# Patient Record
Sex: Female | Born: 1980 | Race: Black or African American | Hispanic: No | Marital: Married | Smoking: Never smoker
Health system: Southern US, Community
[De-identification: ages and names within clinical notes are randomized; demographics above are authoritative.]

## PROBLEM LIST (undated history)

## (undated) HISTORY — PX: CHOLECYSTECTOMY: SHX55

---

## 2019-08-25 ENCOUNTER — Emergency Department (HOSPITAL_COMMUNITY)
Admission: EM | Admit: 2019-08-25 | Discharge: 2019-08-25 | Disposition: A | Discharge status: Home / Self Care | Payer: BC Managed Care – PPO | Attending: Emergency Medicine | Admitting: Emergency Medicine

## 2019-08-25 ENCOUNTER — Encounter (HOSPITAL_COMMUNITY): Payer: Self-pay | Admitting: *Deleted

## 2019-08-25 ENCOUNTER — Other Ambulatory Visit: Payer: Self-pay

## 2019-08-25 DIAGNOSIS — B373 Candidiasis of vulva and vagina: Secondary | ICD-10-CM | POA: Insufficient documentation

## 2019-08-25 DIAGNOSIS — L292 Pruritus vulvae: Secondary | ICD-10-CM | POA: Diagnosis present

## 2019-08-25 DIAGNOSIS — N3001 Acute cystitis with hematuria: Secondary | ICD-10-CM | POA: Insufficient documentation

## 2019-08-25 DIAGNOSIS — N3000 Acute cystitis without hematuria: Secondary | ICD-10-CM

## 2019-08-25 DIAGNOSIS — B3731 Acute candidiasis of vulva and vagina: Secondary | ICD-10-CM

## 2019-08-25 DIAGNOSIS — L509 Urticaria, unspecified: Secondary | ICD-10-CM | POA: Insufficient documentation

## 2019-08-25 LAB — URINALYSIS, ROUTINE W REFLEX MICROSCOPIC
Bilirubin Urine: NEGATIVE
Glucose, UA: NEGATIVE mg/dL
Hgb urine dipstick: NEGATIVE
Ketones, ur: NEGATIVE mg/dL
Nitrite: NEGATIVE
Protein, ur: 30 mg/dL — AB
Specific Gravity, Urine: 1.018 (ref 1.005–1.030)
pH: 6 (ref 5.0–8.0)

## 2019-08-25 LAB — WET PREP, GENITAL
Clue Cells Wet Prep HPF POC: NONE SEEN
Sperm: NONE SEEN
Trich, Wet Prep: NONE SEEN

## 2019-08-25 LAB — POC URINE PREG, ED: Preg Test, Ur: NEGATIVE

## 2019-08-25 MED ORDER — NITROFURANTOIN MONOHYD MACRO 100 MG PO CAPS
100.0000 mg | ORAL_CAPSULE | Freq: Two times a day (BID) | ORAL | 0 refills | Status: DC
Start: 2019-08-25 — End: 2019-09-22

## 2019-08-25 MED ORDER — FLUCONAZOLE 200 MG PO TABS: 200.0000 mg | ORAL_TABLET | Freq: Once | ORAL | 0 refills | Status: AC

## 2019-08-25 MED ORDER — DIPHENHYDRAMINE HCL 25 MG PO CAPS
25.0000 mg | ORAL_CAPSULE | Freq: Once | ORAL | Status: AC
  Administered 2019-08-25: 25 mg via ORAL
  Filled 2019-08-25: qty 1

## 2019-08-25 MED ORDER — FLUCONAZOLE 200 MG PO TABS
200.0000 mg | ORAL_TABLET | Freq: Once | ORAL | Status: AC
  Administered 2019-08-25: 200 mg via ORAL
  Filled 2019-08-25: qty 1

## 2019-08-25 MED ORDER — NITROFURANTOIN MONOHYD MACRO 100 MG PO CAPS
100.0000 mg | ORAL_CAPSULE | Freq: Once | ORAL | Status: AC
  Administered 2019-08-25: 100 mg via ORAL
  Filled 2019-08-25: qty 1

## 2019-08-25 NOTE — ED Notes (Signed)
One touch pt, pt is on antibiotics.

## 2019-08-25 NOTE — ED Provider Notes (Signed)
[facility] EMERGENCY DEPARTMENT Provider Note   CSN: [id number] Arrival date & time: 08/25/19  0034     History Chief Complaint  Patient presents with  . Vaginal Itching    Sandra Walker is a 39 y.o. female with no significant past medical history who presents for evaluation of vaginal pruritus.  Patient states over last 24 hours she has had some pruritus to her bilateral labia.  She denies any discharge or concerns for STDs.  She has had some burning with urination which she states is similar to her prior UTIs.  She is sexually active with her husband.  She does not use protection.  She has no concerns for STDs.  States that she did use Monistat approximately 22 hours ago which did not help.  States she did have a urticaria to her chest earlier today.  No new lotions, perfumes, foods.  She has not taken anything for it.  She denies any current pain.  Prior history of urticaria or allergic reactions.  Denies aggravating relieving factors.  No fever, chills, nausea, vomiting, chest pain, shortness of breath abdominal pain, pelvic pain.  No sensation of throat closing, oral swelling.  Hiastory obtained from patient and past medical records.  No interpreter is used.  HPI     History reviewed. No pertinent past medical history.  There are no problems to display for this patient.   Past Surgical History:  Procedure Laterality Date  . CHOLECYSTECTOMY       OB History   No obstetric history on file.     No family history on file.  Social History   Tobacco Use  . Smoking status: Never Smoker  Substance Use Topics  . Alcohol use: Not Currently  . Drug use: Not Currently    Home Medications Prior to Admission medications   Medication Sig Start Date End Date Taking? Authorizing Provider  fluconazole (DIFLUCAN) 200 MG tablet Take 1 tablet (200 mg total) by mouth once for 1 dose. 08/25/19 08/25/19  Peace Jost A, PA-C  nitrofurantoin,  macrocrystal-monohydrate, (MACROBID) 100 MG capsule Take 1 capsule (100 mg total) by mouth 2 (two) times daily. 08/25/19   Minoru Chap A, PA-C    Allergies    Patient has no allergy information on record.  Review of Systems   Review of Systems  Constitutional: Negative.   HENT: Negative.   Respiratory: Negative.   Gastrointestinal: Negative.   Genitourinary: Positive for dysuria. Negative for decreased urine volume, difficulty urinating, dyspareunia, enuresis, flank pain, frequency, hematuria, menstrual problem, pelvic pain, urgency, vaginal bleeding and vaginal pain.       Labial pruritis  Musculoskeletal: Negative.   Skin: Positive for rash.  Neurological: Negative.   All other systems reviewed and are negative.   Physical Exam Updated Vital Signs BP 125/80 (BP Location: Right Arm)   Pulse 82   Temp 98.5 F (36.9 C) (Oral)   Resp 16   LMP 08/04/2019   SpO2 100%   Physical Exam Vitals and nursing note reviewed.  Constitutional:      General: She is not in acute distress.    Appearance: She is well-developed. She is not ill-appearing, toxic-appearing or diaphoretic.  HENT:     Head: Normocephalic and atraumatic.     Nose: Nose normal.     Mouth/Throat:     Mouth: Mucous membranes are moist.     Comments: No intraoral edema.  No drooling, dysphagia or trismus.  Posterior oropharynx visualized without erythema or exudate.  Eyes:     Pupils: Pupils are equal, round, and reactive to light.  Cardiovascular:     Rate and Rhythm: Normal rate.     Pulses: Normal pulses.     Heart sounds: Normal heart sounds.  Pulmonary:     Effort: Pulmonary effort is normal. No respiratory distress.     Breath sounds: Normal breath sounds.  Abdominal:     General: Bowel sounds are normal. There is no distension.     Comments: Soft, nontender.  Normoactive bowel sounds.  Genitourinary:    Comments: Normal appearing external female genitalia without rashes or lesions, normal vaginal  epithelium. Normal appearing cervix with moderate thick white discharge. No cervical petechiae. Cervical os is closed. There is no bleeding noted at the os.No odor. Bimanual: No CMT, nontender.  No palpable adnexal masses or tenderness. Uterus midline and not fixed. Rectovaginal exam was deferred.  No cystocele or rectocele noted. No pelvic lymphadenopathy noted. Wet prep was obtained.  Cultures for gonorrhea and chlamydia collected. Exam performed with chaperone in room. Musculoskeletal:        General: Normal range of motion.     Cervical back: Normal range of motion and neck supple.  Skin:    General: Skin is warm and dry.     Capillary Refill: Capillary refill takes less than 2 seconds.     Comments: Urticaria to anterior chest wall.  No vesicles, bulla, target lesions, purpura, petechiae.  No fluctuance, induration.  No edema, erythema or warmth.  Neurological:     General: No focal deficit present.     Mental Status: She is alert.     ED Results / Procedures / Treatments   Labs (all labs ordered are listed, but only abnormal results are displayed) Labs Reviewed  WET PREP, GENITAL - Abnormal; Notable for the following components:      Result Value   Yeast Wet Prep HPF POC PRESENT (*)    WBC, Wet Prep HPF POC MODERATE (*)    All other components within normal limits  URINALYSIS, ROUTINE W REFLEX MICROSCOPIC - Abnormal; Notable for the following components:   APPearance CLOUDY (*)    Protein, ur 30 (*)    Leukocytes,Ua LARGE (*)    Bacteria, UA FEW (*)    All other components within normal limits  URINE CULTURE  POC URINE PREG, ED  GC/CHLAMYDIA PROBE AMP ([place]) NOT AT [facility]    EKG None  Radiology No results found.  Procedures Procedures (including critical care time)  Medications Ordered in ED Medications  nitrofurantoin (macrocrystal-monohydrate) (MACROBID) capsule 100 mg (has no administration in time range)  fluconazole (DIFLUCAN) tablet 200 mg (has no  administration in time range)  diphenhydrAMINE (BENADRYL) capsule 25 mg (25 mg Oral Given 08/25/19 0209)    ED Course  I have reviewed the triage vital signs and the nursing notes.  Pertinent labs & imaging results that were available during my care of the patient were reviewed by me and considered in my medical decision making (see chart for details).  39 year old female presents for evaluation multiple complaints.  She is afebrile, nonseptic, not ill-appearing.  First patient with dysuria.  No hematuria, flank pain or abdominal pain.  She is tolerating p.o. intake without difficulty.  Abdomen soft, nontender.  Urinalysis with large leuks, bacteria as well as 21-50 WBC.  Culture urine.  Given symptomatic we will start antibiotics.  Can de-escalate if culture does not grow.  Patient also with urticaria to anterior chest wall.  No new lotions, perfumes, food intake. Patient denies any difficulty breathing or swallowing.  Pt has a patent airway without stridor and is handling secretions without difficulty; no angioedema. No blisters, no pustules, no warmth, no draining sinus tracts, no superficial abscesses, no bullous impetigo, no vesicles, no desquamation, no target lesions with dusky purpura or a central bulla. Not tender to touch. No concern for superimposed infection. No concern for SJS, TEN, TSS, tick borne illness, syphilis or other life-threatening condition.  Give dose of Benadryl.  No evidence of anaphylaxis.  Patient also with vaginal pruritus.  No concerns for STDs.  No pelvic pain.  Patient with thick white discharge on GU exam however she had previously used Monistat.  Will obtain wet prep, GC, chlamydia.  No CMT tenderness or adnexal tenderness.  Low suspicion for PID, TOA, torsion.  Wet prep with yeast as well as WBC. GC/ Chlamydia pending Pregnancy negative UA with UTI  Patient started on Benadryl at home.  Antibiotics given for UTI.  1 dose given of Diflucan here in ED given OTC  medications have not improved patient's symptoms.  The patient has been appropriately medically screened and/or stabilized in the ED. I have low suspicion for any other emergent medical condition which would require further screening, evaluation or treatment in the ED or require inpatient management.  Patient is hemodynamically stable and in no acute distress.  Patient able to ambulate in department prior to ED.  Evaluation does not show acute pathology that would require ongoing or additional emergent interventions while in the emergency department or further inpatient treatment.  I have discussed the diagnosis with the patient and answered all questions.  Pain is been managed while in the emergency department and patient has no further complaints prior to discharge.  Patient is comfortable with plan discussed in room and is stable for discharge at this time.  I have discussed strict return precautions for returning to the emergency department.  Patient was encouraged to follow-up with PCP/specialist refer to at discharge.   MDM Rules/Calculators/A&P                       Final Clinical Impression(s) / ED Diagnoses Final diagnoses:  Urticaria  Acute cystitis without hematuria  Yeast vaginitis    Rx / DC Orders ED Discharge Orders         Ordered    nitrofurantoin, macrocrystal-monohydrate, (MACROBID) 100 MG capsule  2 times daily     08/25/19 0231    fluconazole (DIFLUCAN) 200 MG tablet   Once     08/25/19 0231           Gwendy Boeder A, PA-C 08/25/19 0232    Merryl Hacker, MD 08/25/19 [phone]

## 2019-08-25 NOTE — ED Triage Notes (Signed)
Onset of vaginal itching last night, denies discharge. Burning with urination. Used Monistat dose today, not improved. Onset of rash today on torso.

## 2019-08-25 NOTE — Discharge Instructions (Addendum)
Take the medications as prescribed.  You may take Benadryl or an antihistamine such as Zyrtec or Claritin for your hives.  You develop shortness of breath, sensation of throat closing please seek reevaluation  Written you an antibiotic for your urinary tract infection.  I have given you your first dose of an antibiotic for your yeast infection.  If you continue to have symptoms after 72 hours you may take this additional tablet.  Follow-up outpatient with PCP.  Return for new or worsening symptoms.

## 2019-08-26 LAB — GC/CHLAMYDIA PROBE AMP (~~LOC~~) NOT AT ARMC
Chlamydia: NEGATIVE
Comment: NEGATIVE
Comment: NORMAL
Neisseria Gonorrhea: NEGATIVE

## 2019-08-27 LAB — URINE CULTURE: Culture: 20000 — AB

## 2019-09-08 ENCOUNTER — Ambulatory Visit (HOSPITAL_COMMUNITY)
Admission: EM | Admit: 2019-09-08 | Discharge: 2019-09-08 | Disposition: A | Discharge status: Home / Self Care | Payer: BC Managed Care – PPO | Attending: Family Medicine | Admitting: Family Medicine

## 2019-09-08 ENCOUNTER — Encounter (HOSPITAL_COMMUNITY): Payer: Self-pay

## 2019-09-08 ENCOUNTER — Other Ambulatory Visit: Payer: Self-pay

## 2019-09-08 DIAGNOSIS — L259 Unspecified contact dermatitis, unspecified cause: Secondary | ICD-10-CM

## 2019-09-08 MED ORDER — PREDNISONE 10 MG (21) PO TBPK
ORAL_TABLET | Freq: Every day | ORAL | 0 refills | Status: DC
Start: 2019-09-08 — End: 2019-09-22

## 2019-09-08 NOTE — ED Triage Notes (Signed)
Pt is here with a rash for a week now, pt states it itches then hurts. Pt has taken Benadryl to relieve discomfort. 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

## 2019-09-08 NOTE — ED Provider Notes (Signed)
[facility]   [id number] 09/08/19 Arrival Time: 4098  ASSESSMENT & PLAN:  1. Contact dermatitis, unspecified contact dermatitis type, unspecified trigger    Unknown trigger.  Begin: Meds ordered this encounter  Medications  . predniSONE (STERAPRED UNI-PAK 21 TAB) 10 MG (21) TBPK tablet    Sig: Take by mouth daily. Take as directed.    Dispense:  21 tablet    Refill:  0    Benadryl if needed. Work note provided.  Will follow up with PCP or here if worsening or failing to improve as anticipated. Reviewed expectations re: course of current medical issues. Questions answered. Outlined signs and symptoms indicating need for more acute intervention. Patient verbalized understanding. After Visit Summary given.   SUBJECTIVE:  Syrena Burges is a 39 y.o. female who presents with a skin complaint. "Itchy rash" first noted over the past week. Trunk and arms. Questions related to dust on boxes at work. Benadryl without much relief; "some"; temporary. No swallowing or respiratory difficulties. No h/o similar.    OBJECTIVE: Vitals:   09/08/19 1519 09/08/19 1522  BP:  135/76  Pulse:  74  Resp:  18  Temp:  98.6 F (37 C)  TempSrc:  Oral  SpO2:  100%  Weight: 87.1 kg     General appearance: alert; no distress HEENT: [place]; AT Neck: supple with FROM Lungs: clear to auscultation bilaterally Heart: regular rate and rhythm Extremities: no edema; moves all extremities normally Skin: warm and dry; various areas of skin irritation and papular rash over trunk and extremities; some areas of excoriation; no sign of skin infeciton Psychological: alert and cooperative; normal mood and affect  Allergies  Allergen Reactions  . Penicillin G Anaphylaxis    History reviewed. No pertinent past medical history. Social History   Socioeconomic History  . Marital status: Married    Spouse name: Not on file  . Number of children: Not on file  . Years of education: Not on file  .  Highest education level: Not on file  Occupational History  . Not on file  Tobacco Use  . Smoking status: Never Smoker  . Smokeless tobacco: Never Used  Substance and Sexual Activity  . Alcohol use: Not Currently  . Drug use: Not Currently  . Sexual activity: Yes    Birth control/protection: None    Comment: Married  Other Topics Concern  . Not on file  Social History Narrative  . Not on file   Social Determinants of Health   Financial Resource Strain:   . Difficulty of Paying Living Expenses:   Food Insecurity:   . Worried About Charity fundraiser in the Last Year:   . Arboriculturist in the Last Year:   Transportation Needs:   . Film/video editor (Medical):   Marland Kitchen Lack of Transportation (Non-Medical):   Physical Activity:   . Days of Exercise per Week:   . Minutes of Exercise per Session:   Stress:   . Feeling of Stress :   Social Connections:   . Frequency of Communication with Friends and Family:   . Frequency of Social Gatherings with Friends and Family:   . Attends Religious Services:   . Active Member of Clubs or Organizations:   . Attends Archivist Meetings:   Marland Kitchen Marital Status:   Intimate Partner Violence:   . Fear of Current or Ex-Partner:   . Emotionally Abused:   Marland Kitchen Physically Abused:   . Sexually Abused:  Family History  Problem Relation Age of Onset  . Healthy Mother   . Heart Problems Father   . Diabetes Father    Past Surgical History:  Procedure Laterality Date  . Rosaura Carpenter, MD 09/08/19 1630

## 2019-09-22 ENCOUNTER — Other Ambulatory Visit: Payer: Self-pay

## 2019-09-22 ENCOUNTER — Ambulatory Visit (HOSPITAL_COMMUNITY)
Admission: EM | Admit: 2019-09-22 | Discharge: 2019-09-22 | Disposition: A | Discharge status: Home / Self Care | Payer: BC Managed Care – PPO | Attending: Family Medicine | Admitting: Family Medicine

## 2019-09-22 ENCOUNTER — Encounter (HOSPITAL_COMMUNITY): Payer: Self-pay | Admitting: Emergency Medicine

## 2019-09-22 DIAGNOSIS — R21 Rash and other nonspecific skin eruption: Secondary | ICD-10-CM | POA: Diagnosis not present

## 2019-09-22 MED ORDER — HYDROXYZINE HCL 25 MG PO TABS
25.0000 mg | ORAL_TABLET | Freq: Four times a day (QID) | ORAL | 0 refills | Status: DC
Start: 2019-09-22 — End: 2020-07-31

## 2019-09-22 MED ORDER — PERMETHRIN 5 % EX CREA
TOPICAL_CREAM | CUTANEOUS | 1 refills | Status: DC
Start: 2019-09-22 — End: 2020-10-02

## 2019-09-22 MED ORDER — FLUOCINONIDE 0.05 % EX CREA: 1.0000 "application " | TOPICAL_CREAM | Freq: Two times a day (BID) | CUTANEOUS | 0 refills | Status: DC

## 2019-09-22 NOTE — ED Triage Notes (Signed)
Patient seen 6/10 for the same.  Rash on inner thighs, chest and arms.  medicine seemed to help, but once finished, rash and itching has returned

## 2019-09-27 NOTE — ED Provider Notes (Signed)
[place]   [id number] 09/22/19 Arrival Time: 3335  ASSESSMENT & PLAN:  1. Rash and nonspecific skin eruption      Meds ordered this encounter  Medications  . hydrOXYzine (ATARAX/VISTARIL) 25 MG tablet    Sig: Take 1 tablet (25 mg total) by mouth every 6 (six) hours.    Dispense:  20 tablet    Refill:  0  . fluocinonide cream (LIDEX) 0.05 %    Sig: Apply 1 application topically 2 (two) times daily.    Dispense:  60 g    Refill:  0  . permethrin (ELIMITE) 5 % cream    Sig: Apply from neck down before bed then wash off in the morning. May repeat in one week.    Dispense:  60 g    Refill:  1    May need dermatology evaluation if not improving.   Reviewed expectations re: course of current medical issues. Questions answered. Outlined signs and symptoms indicating need for more acute intervention. Patient verbalized understanding. After Visit Summary given.   SUBJECTIVE:  Sandra Walker is a 39 y.o. female who presents with a skin complaint. Same as last visit with me. Intense itching; inner thighs, upper chest, some on arms. Prednisone helped but "rash is back". No new exposures. No OTC tx.    OBJECTIVE: Vitals:   09/22/19 1817  BP: 129/77  Pulse: 73  Resp: 18  Temp: 99.1 F (37.3 C)  TempSrc: Oral  SpO2: 99%    General appearance: alert; no distress HEENT: [place]; AT Neck: supple with FROM Lungs: clear to auscultation bilaterally Heart: regular rate and rhythm Extremities: no edema; moves all extremities normally Skin: warm and dry; various areas of skin irritation and papular rash over trunk and extremities; some areas of excoriation; no sign of skin infeciton Psychological: alert and cooperative; normal mood and affect  Allergies  Allergen Reactions  . Penicillin G Anaphylaxis    History reviewed. No pertinent past medical history. Social History   Socioeconomic History  . Marital status: Married    Spouse name: Not on file  . Number  of children: Not on file  . Years of education: Not on file  . Highest education level: Not on file  Occupational History  . Not on file  Tobacco Use  . Smoking status: Never Smoker  . Smokeless tobacco: Never Used  Substance and Sexual Activity  . Alcohol use: Not Currently  . Drug use: Not Currently  . Sexual activity: Yes    Birth control/protection: None    Comment: Married  Other Topics Concern  . Not on file  Social History Narrative  . Not on file   Social Determinants of Health   Financial Resource Strain:   . Difficulty of Paying Living Expenses:   Food Insecurity:   . Worried About Charity fundraiser in the Last Year:   . Arboriculturist in the Last Year:   Transportation Needs:   . Film/video editor (Medical):   Marland Kitchen Lack of Transportation (Non-Medical):   Physical Activity:   . Days of Exercise per Week:   . Minutes of Exercise per Session:   Stress:   . Feeling of Stress :   Social Connections:   . Frequency of Communication with Friends and Family:   . Frequency of Social Gatherings with Friends and Family:   . Attends Religious Services:   . Active Member of Clubs or Organizations:   . Attends Archivist Meetings:   .  Marital Status:   Intimate Partner Violence:   . Fear of Current or Ex-Partner:   . Emotionally Abused:   Marland Kitchen Physically Abused:   . Sexually Abused:    Family History  Problem Relation Age of Onset  . Healthy Mother   . Heart Problems Father   . Diabetes Father    Past Surgical History:  Procedure Laterality Date  . Rosaura Carpenter, MD 09/27/19 [phone]

## 2020-01-17 ENCOUNTER — Encounter (HOSPITAL_COMMUNITY): Payer: Self-pay | Admitting: Emergency Medicine

## 2020-01-17 ENCOUNTER — Other Ambulatory Visit: Payer: Self-pay

## 2020-01-17 ENCOUNTER — Telehealth (HOSPITAL_COMMUNITY): Payer: Self-pay | Admitting: Emergency Medicine

## 2020-01-17 ENCOUNTER — Ambulatory Visit (HOSPITAL_COMMUNITY)
Admission: EM | Admit: 2020-01-17 | Discharge: 2020-01-17 | Disposition: A | Discharge status: Home / Self Care | Payer: 59 | Attending: Family Medicine | Admitting: Family Medicine

## 2020-01-17 DIAGNOSIS — L239 Allergic contact dermatitis, unspecified cause: Secondary | ICD-10-CM | POA: Diagnosis not present

## 2020-01-17 DIAGNOSIS — J3089 Other allergic rhinitis: Secondary | ICD-10-CM

## 2020-01-17 DIAGNOSIS — H1011 Acute atopic conjunctivitis, right eye: Secondary | ICD-10-CM | POA: Diagnosis not present

## 2020-01-17 MED ORDER — FLUTICASONE PROPIONATE 50 MCG/ACT NA SUSP
1.0000 | Freq: Two times a day (BID) | NASAL | 0 refills | Status: DC
Start: 2020-01-17 — End: 2020-01-17

## 2020-01-17 MED ORDER — PREDNISONE 10 MG PO TABS: ORAL_TABLET | ORAL | 0 refills | Status: DC

## 2020-01-17 MED ORDER — AZELASTINE HCL 0.05 % OP SOLN: 1.0000 [drp] | Freq: Two times a day (BID) | OPHTHALMIC | 0 refills | Status: DC

## 2020-01-17 MED ORDER — FLUTICASONE PROPIONATE 50 MCG/ACT NA SUSP: 1.0000 | Freq: Two times a day (BID) | NASAL | 0 refills | Status: DC

## 2020-01-17 MED ORDER — LEVOCETIRIZINE DIHYDROCHLORIDE 5 MG PO TABS: 5.0000 mg | ORAL_TABLET | Freq: Every evening | ORAL | 0 refills | Status: DC

## 2020-01-17 MED ORDER — MONTELUKAST SODIUM 10 MG PO TABS: 10.0000 mg | ORAL_TABLET | Freq: Every day | ORAL | 0 refills | Status: DC

## 2020-01-17 MED ORDER — MONTELUKAST SODIUM 10 MG PO TABS
10.0000 mg | ORAL_TABLET | Freq: Every day | ORAL | 0 refills | Status: DC
Start: 2020-01-17 — End: 2020-01-17

## 2020-01-17 NOTE — ED Triage Notes (Addendum)
Sob for 2 days, right eye pressure, patient has a cough, no fever.  Chest soreness noticed with coughing  Patient has seen eye provider for this eye complaint  Patient reports eye issue is a recurrent issue

## 2020-01-17 NOTE — ED Provider Notes (Signed)
[facility]    CSN: [id number] Arrival date & time: 01/17/20  1123      History   Chief Complaint Chief Complaint  Patient presents with  . Shortness of Breath    HPI Sandra Walker is a 39 y.o. female.   Here today with an ongoing issue of itching all over body with hives intermittently, nasal congestion, sinus pressure, mild SOB and chest tightness, and right eye irritation, redness, itching. Was given steroid taper by PCP recently for the rash which resolved issue but issue has come back. Eye doctor prescribed systane drops for eye but this did not help. Took claritin a few times but stopped because she didn't feel it was helping. Recently moved here a year ago from [facility] and since has been having these issues since the move. Denies CP, significant SOB, fever, chills, N/V, abdominal pain.      History reviewed. No pertinent past medical history.  There are no problems to display for this patient.   Past Surgical History:  Procedure Laterality Date  . CHOLECYSTECTOMY      OB History   No obstetric history on file.      Home Medications    Prior to Admission medications   Medication Sig Start Date End Date Taking? Authorizing Provider  fluocinonide cream (LIDEX) 2.20 % Apply 1 application topically 2 (two) times daily. 09/22/19  Yes Hagler, Aaron Edelman, MD  azelastine (OPTIVAR) 0.05 % ophthalmic solution Place 1 drop into the right eye 2 (two) times daily. 01/17/20   Volney American, PA-C  fluticasone [facility]) 50 MCG/ACT nasal spray Place 1 spray into both nostrils 2 (two) times daily. 01/17/20   Volney American, PA-C  hydrOXYzine (ATARAX/VISTARIL) 25 MG tablet Take 1 tablet (25 mg total) by mouth every 6 (six) hours. 09/22/19   Vanessa Kick, MD  levocetirizine (XYZAL) 5 MG tablet Take 1 tablet (5 mg total) by mouth every evening. 01/17/20   Volney American, PA-C  montelukast (SINGULAIR) 10 MG tablet Take 1 tablet (10 mg total) by mouth at  bedtime. 01/17/20   Volney American, PA-C  permethrin (ELIMITE) 5 % cream Apply from neck down before bed then wash off in the morning. May repeat in one week. 09/22/19   Vanessa Kick, MD  predniSONE (DELTASONE) 10 MG tablet Take 6 tabs day one, 5 tabs day two, 4 tabs day three, etc 01/17/20   Volney American, PA-C    Family History Family History  Problem Relation Age of Onset  . Healthy Mother   . Heart Problems Father   . Diabetes Father     Social History Social History   Tobacco Use  . Smoking status: Never Smoker  . Smokeless tobacco: Never Used  Substance Use Topics  . Alcohol use: Yes  . Drug use: Not Currently     Allergies   Penicillin g   Review of Systems Review of Systems PER HPI   Physical Exam Triage Vital Signs ED Triage Vitals  Enc Vitals Group     BP 01/17/20 1228 119/84     Pulse Rate 01/17/20 1228 80     Resp 01/17/20 1228 20     Temp 01/17/20 1228 99 F (37.2 C)     Temp Source 01/17/20 1228 Oral     SpO2 01/17/20 1228 100 %     Weight --      Height --      Head Circumference --      Peak Flow --  Pain Score 01/17/20 1224 6     Pain Loc --      Pain Edu? --      Excl. in [place]? --    No data found.  Updated Vital Signs BP 119/84 (BP Location: Left Arm)   Pulse 80   Temp 99 F (37.2 C) (Oral)   Resp 20   LMP 01/17/2020   SpO2 100%   Visual Acuity Right Eye Distance:   Left Eye Distance:   Bilateral Distance:    Right Eye Near:   Left Eye Near:    Bilateral Near:     Physical Exam Vitals and nursing note reviewed.  Constitutional:      Appearance: Normal appearance. She is not ill-appearing.  HENT:     Head: Atraumatic.     Nose:     Comments: B/l nares erythematous, edematous, rhinorrhea present    Mouth/Throat:     Mouth: Mucous membranes are moist.     Pharynx: Posterior oropharyngeal erythema present.  Eyes:     Extraocular Movements: Extraocular movements intact.     Conjunctiva/sclera:  Conjunctivae normal.  Cardiovascular:     Rate and Rhythm: Normal rate and regular rhythm.     Heart sounds: Normal heart sounds.  Pulmonary:     Effort: Pulmonary effort is normal.     Breath sounds: Normal breath sounds. No wheezing or rales.  Musculoskeletal:        General: Normal range of motion.     Cervical back: Normal range of motion and neck supple.  Skin:    General: Skin is warm and dry.     Findings: Rash present.  Neurological:     Mental Status: She is alert and oriented to person, place, and time.  Psychiatric:        Mood and Affect: Mood normal.        Thought Content: Thought content normal.        Judgment: Judgment normal.     UC Treatments / Results  Labs (all labs ordered are listed, but only abnormal results are displayed) Labs Reviewed - No data to display  EKG   Radiology No results found.  Procedures Procedures (including critical care time)  Medications Ordered in UC Medications - No data to display  Initial Impression / Assessment and Plan / UC Course  I have reviewed the triage vital signs and the nursing notes.  Pertinent labs & imaging results that were available during my care of the patient were reviewed by me and considered in my medical decision making (see chart for details).     Suspect poorly controlled allergic rhinitis, conjunctivitis, and dermatitis causing majority of her sxs - tx with consistent use of singulair, xyzal, optivar drops and flonase as well as prednisone taper for initial benefit. F/u with PCP and consider allergy testing if recurring.   Final Clinical Impressions(s) / UC Diagnoses   Final diagnoses:  Seasonal allergic rhinitis due to other allergic trigger  Allergic conjunctivitis of right eye  Allergic dermatitis   Discharge Instructions   None    ED Prescriptions    Medication Sig Dispense Auth. Provider   predniSONE (DELTASONE) 10 MG tablet Take 6 tabs day one, 5 tabs day two, 4 tabs day three,  etc 21 tablet Volney American, PA-C   levocetirizine (XYZAL) 5 MG tablet Take 1 tablet (5 mg total) by mouth every evening. 30 tablet Volney American, PA-C   montelukast (SINGULAIR) 10 MG tablet Take 1 tablet (  10 mg total) by mouth at bedtime. 30 tablet Volney American, [place]   azelastine (OPTIVAR) 0.05 % ophthalmic solution Place 1 drop into the right eye 2 (two) times daily. 6 mL Volney American, PA-C   fluticasone [facility]) 50 MCG/ACT nasal spray Place 1 spray into both nostrils 2 (two) times daily. 16 g Volney American, [place]     PDMP not reviewed this encounter.   Volney American, [place] 01/17/20 1321

## 2020-01-17 NOTE — Telephone Encounter (Signed)
Patient called stating [facility] is considered "out-of-network" and asked for prescriptions to be resent to [facility] across the street.  Prescriptions resent.

## 2020-02-18 ENCOUNTER — Other Ambulatory Visit: Payer: Self-pay

## 2020-02-18 ENCOUNTER — Encounter (HOSPITAL_COMMUNITY): Payer: Self-pay | Admitting: *Deleted

## 2020-02-18 ENCOUNTER — Emergency Department (HOSPITAL_COMMUNITY): Payer: 59

## 2020-02-18 ENCOUNTER — Emergency Department (HOSPITAL_COMMUNITY)
Admission: EM | Admit: 2020-02-18 | Discharge: 2020-02-18 | Disposition: A | Discharge status: Home / Self Care | Payer: 59 | Attending: Emergency Medicine | Admitting: Emergency Medicine

## 2020-02-18 DIAGNOSIS — R519 Headache, unspecified: Secondary | ICD-10-CM | POA: Diagnosis present

## 2020-02-18 DIAGNOSIS — G43809 Other migraine, not intractable, without status migrainosus: Secondary | ICD-10-CM

## 2020-02-18 LAB — CBC WITH DIFFERENTIAL/PLATELET
Abs Immature Granulocytes: 0.01 10*3/uL (ref 0.00–0.07)
Basophils Absolute: 0 10*3/uL (ref 0.0–0.1)
Basophils Relative: 1 %
Eosinophils Absolute: 0.4 10*3/uL (ref 0.0–0.5)
Eosinophils Relative: 7 %
HCT: 40.8 % (ref 36.0–46.0)
Hemoglobin: 13.1 g/dL (ref 12.0–15.0)
Immature Granulocytes: 0 %
Lymphocytes Relative: 37 %
Lymphs Abs: 2.2 10*3/uL (ref 0.7–4.0)
MCH: 30.1 pg (ref 26.0–34.0)
MCHC: 32.1 g/dL (ref 30.0–36.0)
MCV: 93.8 fL (ref 80.0–100.0)
Monocytes Absolute: 0.5 10*3/uL (ref 0.1–1.0)
Monocytes Relative: 8 %
Neutro Abs: 2.9 10*3/uL (ref 1.7–7.7)
Neutrophils Relative %: 47 %
Platelets: 274 10*3/uL (ref 150–400)
RBC: 4.35 MIL/uL (ref 3.87–5.11)
RDW: 12.3 % (ref 11.5–15.5)
WBC: 6 10*3/uL (ref 4.0–10.5)
nRBC: 0 % (ref 0.0–0.2)

## 2020-02-18 LAB — BASIC METABOLIC PANEL
Anion gap: 8 (ref 5–15)
BUN: 12 mg/dL (ref 6–20)
CO2: 26 mmol/L (ref 22–32)
Calcium: 9.3 mg/dL (ref 8.9–10.3)
Chloride: 106 mmol/L (ref 98–111)
Creatinine, Ser: 0.82 mg/dL (ref 0.44–1.00)
GFR, Estimated: >60 mL/min (ref >60)
Glucose, Bld: 87 mg/dL (ref 70–99)
Potassium: 4 mmol/L (ref 3.5–5.1)
Sodium: 140 mmol/L (ref 135–145)

## 2020-02-18 LAB — I-STAT BETA HCG BLOOD, ED (MC, WL, AP ONLY): I-stat hCG, quantitative: <5 m[IU]/mL (ref <5)

## 2020-02-18 MED ORDER — DEXAMETHASONE SODIUM PHOSPHATE 4 MG/ML IJ SOLN
4.0000 mg | Freq: Once | INTRAMUSCULAR | Status: AC
  Administered 2020-02-18: 4 mg via INTRAVENOUS
  Filled 2020-02-18: qty 1

## 2020-02-18 MED ORDER — SUMATRIPTAN SUCCINATE 100 MG PO TABS
100.0000 mg | ORAL_TABLET | ORAL | 0 refills | Status: DC | PRN
Start: 2020-02-18 — End: 2020-10-02

## 2020-02-18 MED ORDER — METOCLOPRAMIDE HCL 5 MG/ML IJ SOLN
10.0000 mg | Freq: Once | INTRAMUSCULAR | Status: AC
  Administered 2020-02-18: 10 mg via INTRAVENOUS
  Filled 2020-02-18: qty 2

## 2020-02-18 MED ORDER — DIPHENHYDRAMINE HCL 50 MG/ML IJ SOLN
25.0000 mg | Freq: Once | INTRAMUSCULAR | Status: AC
  Administered 2020-02-18: 25 mg via INTRAVENOUS
  Filled 2020-02-18: qty 1

## 2020-02-18 MED ORDER — SODIUM CHLORIDE 0.9 % IV BOLUS
1000.0000 mL | Freq: Once | INTRAVENOUS | Status: AC
  Administered 2020-02-18: 1000 mL via INTRAVENOUS

## 2020-02-18 NOTE — ED Notes (Signed)
Pt taken to CT.

## 2020-02-18 NOTE — ED Provider Notes (Signed)
[facility] EMERGENCY DEPARTMENT Provider Note   CSN: [id number] Arrival date & time: 02/18/20  1609     History Chief Complaint  Patient presents with  . Migraine    Sandra Walker is a 39 y.o. female here presenting with headaches.  Patient has been having intermittent headaches for about a year or so.  For the last 3 weeks it got worse.  The last 2 days she felt something on the right side of her head that is moving.  She was concerned because her brother had similar symptoms and turned out he had hydrocephalus and needed a shunt.  Patient states that she was seen at [place] previously and is still on Excedrin.  She has been taking Excedrin with no relief.  Patient denies any nausea or vomiting or neck pain or fevers.  The history is provided by the patient.       History reviewed. No pertinent past medical history.  There are no problems to display for this patient.   Past Surgical History:  Procedure Laterality Date  . CHOLECYSTECTOMY       OB History   No obstetric history on file.     Family History  Problem Relation Age of Onset  . Healthy Mother   . Heart Problems Father   . Diabetes Father     Social History   Tobacco Use  . Smoking status: Never Smoker  . Smokeless tobacco: Never Used  Substance Use Topics  . Alcohol use: Yes  . Drug use: Not Currently    Home Medications Prior to Admission medications   Medication Sig Start Date End Date Taking? Authorizing Provider  azelastine (OPTIVAR) 0.05 % ophthalmic solution Place 1 drop into the right eye 2 (two) times daily. 01/17/20   Volney American, PA-C  fluocinonide cream (LIDEX) 0.17 % Apply 1 application topically 2 (two) times daily. 09/22/19   Vanessa Kick, MD  fluticasone (FLONASE) 50 MCG/ACT nasal spray Place 1 spray into both nostrils 2 (two) times daily. 01/17/20   Volney American, PA-C  hydrOXYzine (ATARAX/VISTARIL) 25 MG tablet Take 1 tablet (25 mg total)  by mouth every 6 (six) hours. 09/22/19   Vanessa Kick, MD  levocetirizine (XYZAL) 5 MG tablet Take 1 tablet (5 mg total) by mouth every evening. 01/17/20   Volney American, PA-C  montelukast (SINGULAIR) 10 MG tablet Take 1 tablet (10 mg total) by mouth at bedtime. 01/17/20   Volney American, PA-C  permethrin (ELIMITE) 5 % cream Apply from neck down before bed then wash off in the morning. May repeat in one week. 09/22/19   Vanessa Kick, MD  predniSONE (DELTASONE) 10 MG tablet Take 6 tabs day one, 5 tabs day two, 4 tabs day three, etc 01/17/20   Volney American, PA-C    Allergies    Penicillin g and Shellfish allergy  Review of Systems   Review of Systems  Neurological: Positive for headaches.  All other systems reviewed and are negative.   Physical Exam Updated Vital Signs BP 113/75 (BP Location: Right Arm)   Pulse 61   Temp 97.8 F (36.6 C) (Oral)   Resp 18   Ht 5\' 8"  (1.727 m)   Wt 91.6 kg   LMP 02/11/2020   SpO2 100%   BMI 30.71 kg/m   Physical Exam Vitals and nursing note reviewed.  Constitutional:      Comments: Slightly uncomfortable   HENT:     Head: Normocephalic.  Nose: Nose normal.     Mouth/Throat:     Mouth: Mucous membranes are moist.  Eyes:     Extraocular Movements: Extraocular movements intact.     Pupils: Pupils are equal, round, and reactive to light.  Cardiovascular:     Rate and Rhythm: Normal rate and regular rhythm.     Pulses: Normal pulses.     Heart sounds: Normal heart sounds.  Pulmonary:     Effort: Pulmonary effort is normal.     Breath sounds: Normal breath sounds.  Abdominal:     General: Abdomen is flat.     Palpations: Abdomen is soft.  Musculoskeletal:        General: Normal range of motion.     Cervical back: Normal range of motion and neck supple.  Skin:    General: Skin is warm.     Capillary Refill: Capillary refill takes less than 2 seconds.  Neurological:     General: No focal deficit present.      Mental Status: She is alert and oriented to person, place, and time.     Cranial Nerves: No cranial nerve deficit.     Sensory: No sensory deficit.     Motor: No weakness.     Coordination: Coordination normal.  Psychiatric:        Mood and Affect: Mood normal.        Behavior: Behavior normal.     ED Results / Procedures / Treatments   Labs (all labs ordered are listed, but only abnormal results are displayed) Labs Reviewed  CBC WITH DIFFERENTIAL/PLATELET  BASIC METABOLIC PANEL  I-STAT BETA HCG BLOOD, ED ([facility], [facility], [facility] ONLY)    EKG None  Radiology CT Head Wo Contrast  Result Date: 02/18/2020 CLINICAL DATA:  Cerebral hemorrhage suspected. Migraine for 2 weeks. EXAM: CT HEAD WITHOUT CONTRAST TECHNIQUE: Contiguous axial images were obtained from the base of the skull through the vertex without intravenous contrast. COMPARISON:  None. FINDINGS: Brain: No evidence of acute infarction, hemorrhage, hydrocephalus, extra-axial collection or mass lesion/mass effect. Vascular: No hyperdense vessel or unexpected calcification. Skull: Normal. Negative for fracture or focal lesion. Sinuses/Orbits: No acute finding. IMPRESSION: Negative head CT. Electronically Signed   By: Monte Fantasia M.D.   On: 02/18/2020 19:21    Procedures Procedures (including critical care time)  Medications Ordered in ED Medications  sodium chloride 0.9 % bolus 1,000 mL (1,000 mLs Intravenous New Bag/Given 02/18/20 1729)  metoCLOPramide (REGLAN) injection 10 mg (10 mg Intravenous Given 02/18/20 1806)  diphenhydrAMINE (BENADRYL) injection 25 mg (25 mg Intravenous Given 02/18/20 1808)  dexamethasone (DECADRON) injection 4 mg (4 mg Intravenous Given 02/18/20 1810)    ED Course  I have reviewed the triage vital signs and the nursing notes.  Pertinent labs & imaging results that were available during my care of the patient were reviewed by me and considered in my medical decision making (see chart for details).     MDM Rules/Calculators/A&P                         Sandra Walker is a 39 y.o. female here presenting with headaches.  Patient has been having headaches intermittently for a year and that got worse over the last several days.  Will get CT head to rule out mass.  But I think likely migraines.  She is concerned because her brother had normal pressure hydrocephalus.  She states that she did not have neuro imaging for several  years and does not have a neurologist.  Plan to get CT head and labs and give migraine cocktail.  8:09 PM Labs and CT head unremarkable.  Felt better after migraine cocktail.  Will discharge home with Imitrex and have her follow-up with neurology.   Final Clinical Impression(s) / ED Diagnoses Final diagnoses:  None    Rx / DC Orders ED Discharge Orders    None       Drenda Freeze, MD 02/18/20 2010

## 2020-02-18 NOTE — ED Triage Notes (Signed)
The pt is c/o a mograine headache for 3 weeks for 2 days she has felt  Movement in her rt forehead that she has not had previoously  lmp last week

## 2020-02-18 NOTE — Discharge Instructions (Signed)
Your CT head and labs are unremarkable today.  Continue Excedrin for headaches.  Take Imitrex for severe headaches see neurology for follow-up to start on medicines to prevent headaches.  Return to ER if you have worse headaches, vomiting, weakness, numbness.

## 2020-02-18 NOTE — ED Notes (Signed)
Patient verbalizes understanding of discharge instructions. Prescriptions reviewed. Opportunity for questioning and answers were provided. Armband removed by staff, pt discharged from ED ambulatory.

## 2020-07-20 ENCOUNTER — Other Ambulatory Visit: Payer: Self-pay

## 2020-07-20 ENCOUNTER — Encounter: Payer: Self-pay | Admitting: Plastic Surgery

## 2020-07-20 ENCOUNTER — Ambulatory Visit (INDEPENDENT_AMBULATORY_CARE_PROVIDER_SITE_OTHER): Payer: Managed Care, Other (non HMO) | Admitting: Plastic Surgery

## 2020-07-20 DIAGNOSIS — D1800 Hemangioma unspecified site: Secondary | ICD-10-CM

## 2020-07-20 NOTE — Progress Notes (Signed)
Patient ID: Sandra Walker, female    DOB: [birth year], 40 y.o.   MRN: [id number]   Chief Complaint  Patient presents with  . consult  . Skin Problem    The patient is a 40 year old female here for evaluation of her lower lip.  The patient states that she noticed increasing groin area on her lower lip on the left side.  She was evaluated by Dr. Harlow Mares previously but did not have insurance at the time.  She says its been there for years but is getting steadily larger.  She is otherwise very healthy.  She is not aware of any trauma certainly nothing recent.  She has asthma and underwent a cholecystectomy is otherwise in good health.  The area is about a centimeter and a half and looks like a venous lake or an angioma.   Review of Systems  Constitutional: Negative.   HENT: Negative.   Eyes: Negative.   Respiratory: Negative.   Cardiovascular: Negative.   Gastrointestinal: Negative.   Endocrine: Negative.   Genitourinary: Negative.   Musculoskeletal: Negative.   Skin: Positive for color change.  Hematological: Negative.   Psychiatric/Behavioral: Negative.     History reviewed. No pertinent past medical history.  Past Surgical History:  Procedure Laterality Date  . CHOLECYSTECTOMY        Current Outpatient Medications:  .  azelastine (OPTIVAR) 0.05 % ophthalmic solution, Place 1 drop into the right eye 2 (two) times daily., Disp: 6 mL, Rfl: 0 .  fluocinonide cream (LIDEX) 6.64 %, Apply 1 application topically 2 (two) times daily., Disp: 60 g, Rfl: 0 .  fluticasone (FLONASE) 50 MCG/ACT nasal spray, Place 1 spray into both nostrils 2 (two) times daily., Disp: 16 g, Rfl: 0 .  hydrOXYzine (ATARAX/VISTARIL) 25 MG tablet, Take 1 tablet (25 mg total) by mouth every 6 (six) hours., Disp: 20 tablet, Rfl: 0 .  levocetirizine (XYZAL) 5 MG tablet, Take 1 tablet (5 mg total) by mouth every evening., Disp: 30 tablet, Rfl: 0 .  montelukast (SINGULAIR) 10 MG tablet, Take 1 tablet (10 mg total)  by mouth at bedtime., Disp: 30 tablet, Rfl: 0 .  permethrin (ELIMITE) 5 % cream, Apply from neck down before bed then wash off in the morning. May repeat in one week., Disp: 60 g, Rfl: 1 .  predniSONE (DELTASONE) 10 MG tablet, Take 6 tabs day one, 5 tabs day two, 4 tabs day three, etc, Disp: 21 tablet, Rfl: 0 .  SUMAtriptan (IMITREX) 100 MG tablet, Take 1 tablet (100 mg total) by mouth every 2 (two) hours as needed for migraine. May repeat in 2 hours if headache persists or recurs., Disp: 10 tablet, Rfl: 0   Objective:   Vitals:   07/20/20 0850  BP: 115/81  Pulse: 86  SpO2: 99%    Physical Exam Vitals and nursing note reviewed.  Constitutional:      Appearance: Normal appearance.  HENT:     Head: Normocephalic.     Mouth/Throat:   Cardiovascular:     Rate and Rhythm: Normal rate.     Pulses: Normal pulses.  Pulmonary:     Effort: Pulmonary effort is normal. No respiratory distress.  Abdominal:     General: Abdomen is flat. There is no distension.  Skin:    General: Skin is warm.     Capillary Refill: Capillary refill takes less than 2 seconds.     Coloration: Skin is not jaundiced.     Findings: Lesion present. No  bruising.  Neurological:     General: No focal deficit present.     Mental Status: She is alert and oriented to person, place, and time.  Psychiatric:        Mood and Affect: Mood normal.     Assessment & Plan:  Angioma, venous  We discussed the options for treatment.  Laser and surgery are options.  The patient agrees to try the laser first and see if that will take care of it or if it will even make it smaller.  That is what I would recommend and then surgery is certainly an option if it does not resolve.  Pictures were obtained of the patient and placed in the chart with the patient's or guardian's permission.   [place], DO

## 2020-07-31 ENCOUNTER — Emergency Department (HOSPITAL_COMMUNITY)
Admission: EM | Admit: 2020-07-31 | Discharge: 2020-07-31 | Disposition: A | Discharge status: Home / Self Care | Payer: Managed Care, Other (non HMO) | Attending: Emergency Medicine | Admitting: Emergency Medicine

## 2020-07-31 ENCOUNTER — Other Ambulatory Visit: Payer: Self-pay

## 2020-07-31 ENCOUNTER — Emergency Department (HOSPITAL_COMMUNITY): Payer: Managed Care, Other (non HMO)

## 2020-07-31 ENCOUNTER — Encounter (HOSPITAL_COMMUNITY): Payer: Self-pay

## 2020-07-31 DIAGNOSIS — R109 Unspecified abdominal pain: Secondary | ICD-10-CM | POA: Diagnosis present

## 2020-07-31 DIAGNOSIS — R110 Nausea: Secondary | ICD-10-CM | POA: Insufficient documentation

## 2020-07-31 DIAGNOSIS — R0602 Shortness of breath: Secondary | ICD-10-CM | POA: Insufficient documentation

## 2020-07-31 DIAGNOSIS — Z91040 Latex allergy status: Secondary | ICD-10-CM | POA: Diagnosis not present

## 2020-07-31 DIAGNOSIS — T887XXA Unspecified adverse effect of drug or medicament, initial encounter: Secondary | ICD-10-CM

## 2020-07-31 LAB — COMPREHENSIVE METABOLIC PANEL
ALT: 12 U/L (ref 0–44)
AST: 19 U/L (ref 15–41)
Albumin: 3.8 g/dL (ref 3.5–5.0)
Alkaline Phosphatase: 55 U/L (ref 38–126)
Anion gap: 6 (ref 5–15)
BUN: 10 mg/dL (ref 6–20)
CO2: 25 mmol/L (ref 22–32)
Calcium: 8.8 mg/dL — ABNORMAL LOW (ref 8.9–10.3)
Chloride: 105 mmol/L (ref 98–111)
Creatinine, Ser: 0.9 mg/dL (ref 0.44–1.00)
GFR, Estimated: >60 mL/min (ref >60)
Glucose, Bld: 92 mg/dL (ref 70–99)
Potassium: 4.4 mmol/L (ref 3.5–5.1)
Sodium: 136 mmol/L (ref 135–145)
Total Bilirubin: 0.6 mg/dL (ref 0.3–1.2)
Total Protein: 7.1 g/dL (ref 6.5–8.1)

## 2020-07-31 LAB — CBC WITH DIFFERENTIAL/PLATELET
Abs Immature Granulocytes: 0.01 10*3/uL (ref 0.00–0.07)
Basophils Absolute: 0.1 10*3/uL (ref 0.0–0.1)
Basophils Relative: 1 %
Eosinophils Absolute: 0.4 10*3/uL (ref 0.0–0.5)
Eosinophils Relative: 6 %
HCT: 38.7 % (ref 36.0–46.0)
Hemoglobin: 13 g/dL (ref 12.0–15.0)
Immature Granulocytes: 0 %
Lymphocytes Relative: 46 %
Lymphs Abs: 3.1 10*3/uL (ref 0.7–4.0)
MCH: 31.3 pg (ref 26.0–34.0)
MCHC: 33.6 g/dL (ref 30.0–36.0)
MCV: 93 fL (ref 80.0–100.0)
Monocytes Absolute: 0.5 10*3/uL (ref 0.1–1.0)
Monocytes Relative: 8 %
Neutro Abs: 2.6 10*3/uL (ref 1.7–7.7)
Neutrophils Relative %: 39 %
Platelets: 217 10*3/uL (ref 150–400)
RBC: 4.16 MIL/uL (ref 3.87–5.11)
RDW: 12.5 % (ref 11.5–15.5)
WBC: 6.6 10*3/uL (ref 4.0–10.5)
nRBC: 0 % (ref 0.0–0.2)

## 2020-07-31 LAB — LIPASE, BLOOD: Lipase: 45 U/L (ref 11–51)

## 2020-07-31 LAB — I-STAT BETA HCG BLOOD, ED (MC, WL, AP ONLY): I-stat hCG, quantitative: <5 m[IU]/mL (ref <5)

## 2020-07-31 LAB — TROPONIN I (HIGH SENSITIVITY)
Troponin I (High Sensitivity): <2 ng/L (ref <18)
Troponin I (High Sensitivity): <2 ng/L (ref <18)

## 2020-07-31 MED ORDER — ALUM & MAG HYDROXIDE-SIMETH 200-200-20 MG/5ML PO SUSP
30.0000 mL | Freq: Once | ORAL | Status: AC
  Administered 2020-07-31: 30 mL via ORAL
  Filled 2020-07-31: qty 30

## 2020-07-31 MED ORDER — DICYCLOMINE HCL 10 MG PO CAPS
10.0000 mg | ORAL_CAPSULE | Freq: Once | ORAL | Status: AC
  Administered 2020-07-31: 10 mg via ORAL
  Filled 2020-07-31: qty 1

## 2020-07-31 MED ORDER — DICYCLOMINE HCL 20 MG PO TABS: 20.0000 mg | ORAL_TABLET | Freq: Two times a day (BID) | ORAL | 0 refills | Status: DC

## 2020-07-31 MED ORDER — MORPHINE SULFATE (PF) 4 MG/ML IV SOLN: 4.0000 mg | Freq: Once | INTRAVENOUS | Status: DC

## 2020-07-31 MED ORDER — ONDANSETRON 4 MG PO TBDP
8.0000 mg | ORAL_TABLET | Freq: Once | ORAL | Status: AC
  Administered 2020-07-31: 8 mg via ORAL
  Filled 2020-07-31: qty 2

## 2020-07-31 MED ORDER — NAPROXEN 250 MG PO TABS
500.0000 mg | ORAL_TABLET | Freq: Once | ORAL | Status: AC
  Administered 2020-07-31: 500 mg via ORAL
  Filled 2020-07-31: qty 2

## 2020-07-31 MED ORDER — SODIUM CHLORIDE 0.9 % IV BOLUS: 1000.0000 mL | Freq: Once | INTRAVENOUS | Status: DC

## 2020-07-31 MED ORDER — OMEPRAZOLE 20 MG PO CPDR: 20.0000 mg | DELAYED_RELEASE_CAPSULE | Freq: Two times a day (BID) | ORAL | 0 refills | Status: DC

## 2020-07-31 NOTE — ED Triage Notes (Signed)
Pt c/o shortness of breath, body aches, nausea, indigestion, constipation x 2 days. Shortness of breath worsened today. Pt states she took magnesium citrate yesterday and had watery diarrhea today.

## 2020-07-31 NOTE — ED Provider Notes (Signed)
[facility] EMERGENCY DEPARTMENT Provider Note   CSN: [id number] Arrival date & time: 07/31/20  0005     History Chief Complaint  Patient presents with  . Shortness of Breath    Sandra Walker is a 40 y.o. female.  The history is provided by the patient.  Abdominal Cramping This is a new problem. The current episode started 2 days ago. The problem occurs constantly. The problem has not changed since onset.Associated symptoms include abdominal pain. Pertinent negatives include no chest pain and no headaches. Nothing aggravates the symptoms. Nothing relieves the symptoms. She has tried nothing for the symptoms. The treatment provided no relief.  Also has mild SOB with the pain.  Sone nausea and was constipated until she took mag citrate but has cramping with the BMs.  No CP, no DOE, no leg pain or swelling, no travel, no OCP.       History reviewed. No pertinent past medical history.  Patient Active Problem List   Diagnosis Date Noted  . 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 CHOLECYSTECTOMY       OB History   No obstetric history on file.     Family History  Problem Relation Age of Onset  . Healthy Mother   . Heart Problems Father   . Diabetes Father     Social History   Tobacco Use  . Smoking status: Never Smoker  . Smokeless tobacco: Never Used  Substance Use Topics  . Alcohol use: Yes  . Drug use: Not Currently    Home Medications Prior to Admission medications   Medication Sig Start Date End Date Taking? Authorizing Provider  azelastine (OPTIVAR) 0.05 % ophthalmic solution Place 1 drop into the right eye 2 (two) times daily. Patient not taking: Reported on 07/31/2020 01/17/20   Volney American, PA-C  fluticasone [facility]) 50 MCG/ACT nasal spray Place 1 spray into both nostrils 2 (two) times daily. Patient not taking: Reported on 07/31/2020 01/17/20   Volney American, PA-C   levocetirizine (XYZAL) 5 MG tablet Take 1 tablet (5 mg total) by mouth every evening. Patient not taking: Reported on 07/31/2020 01/17/20   Volney American, PA-C  montelukast (SINGULAIR) 10 MG tablet Take 1 tablet (10 mg total) by mouth at bedtime. Patient not taking: Reported on 07/31/2020 01/17/20   Volney American, PA-C  permethrin (ELIMITE) 5 % cream Apply from neck down before bed then wash off in the morning. May repeat in one week. Patient not taking: Reported on 07/31/2020 09/22/19   Vanessa Kick, MD  predniSONE (DELTASONE) 10 MG tablet Take 6 tabs day one, 5 tabs day two, 4 tabs day three, etc Patient not taking: Reported on 07/31/2020 01/17/20   Volney American, PA-C  SUMAtriptan (IMITREX) 100 MG tablet Take 1 tablet (100 mg total) by mouth every 2 (two) hours as needed for migraine. May repeat in 2 hours if headache persists or recurs. Patient not taking: Reported on 07/31/2020 02/18/20   Drenda Freeze, MD    Allergies    Penicillin g, Latex, Penicillins, and Shellfish allergy  Review of Systems   Review of Systems  Constitutional: Negative for fever.  HENT: Negative for congestion.   Eyes: Negative for visual disturbance.  Respiratory: Negative for cough and wheezing.   Cardiovascular: Negative for chest pain, palpitations and leg swelling.  Gastrointestinal: Positive for abdominal pain, constipation and nausea.  Genitourinary: Negative for dysuria.  Musculoskeletal: Negative for  arthralgias.  Skin: Negative for rash.  Neurological: Negative for headaches.  Psychiatric/Behavioral: The patient is nervous/anxious.   All other systems reviewed and are negative.   Physical Exam Updated Vital Signs BP 131/81   Pulse 83   Temp 98.7 F (37.1 C) (Oral)   Resp 14   SpO2 100%   Physical Exam Vitals and nursing note reviewed.  Constitutional:      General: She is not in acute distress.    Appearance: Normal appearance.  HENT:     Head: Normocephalic  and atraumatic.     Nose: Nose normal.  Eyes:     Conjunctiva/sclera: Conjunctivae normal.     Pupils: Pupils are equal, round, and reactive to light.  Cardiovascular:     Rate and Rhythm: Normal rate and regular rhythm.     Pulses: Normal pulses.     Heart sounds: Normal heart sounds.  Pulmonary:     Effort: Pulmonary effort is normal. No respiratory distress.     Breath sounds: Normal breath sounds.  Abdominal:     General: Abdomen is flat. Bowel sounds are normal.     Palpations: Abdomen is soft.     Tenderness: There is no abdominal tenderness. There is no guarding or rebound.  Musculoskeletal:        General: No tenderness.     Cervical back: Normal range of motion and neck supple.     Right lower leg: No edema.     Left lower leg: No edema.  Skin:    General: Skin is warm and dry.     Capillary Refill: Capillary refill takes less than 2 seconds.  Neurological:     General: No focal deficit present.     Mental Status: She is alert and oriented to person, place, and time.  Psychiatric:        Mood and Affect: Mood is anxious.        Thought Content: Thought content normal.     ED Results / Procedures / Treatments   Labs (all labs ordered are listed, but only abnormal results are displayed) Results for orders placed or performed during the hospital encounter of 07/31/20  CBC with Differential/Platelet  Result Value Ref Range   WBC 6.6 4.0 - 10.5 K/uL   RBC 4.16 3.87 - 5.11 MIL/uL   Hemoglobin 13.0 12.0 - 15.0 g/dL   HCT 38.7 36.0 - 46.0 %   MCV 93.0 80.0 - 100.0 fL   MCH 31.3 26.0 - 34.0 pg   MCHC 33.6 30.0 - 36.0 g/dL   RDW 12.5 11.5 - 15.5 %   Platelets 217 150 - 400 K/uL   nRBC 0.0 0.0 - 0.2 %   Neutrophils Relative % 39 %   Neutro Abs 2.6 1.7 - 7.7 K/uL   Lymphocytes Relative 46 %   Lymphs Abs 3.1 0.7 - 4.0 K/uL   Monocytes Relative 8 %   Monocytes Absolute 0.5 0.1 - 1.0 K/uL   Eosinophils Relative 6 %   Eosinophils Absolute 0.4 0.0 - 0.5 K/uL    Basophils Relative 1 %   Basophils Absolute 0.1 0.0 - 0.1 K/uL   Immature Granulocytes 0 %   Abs Immature Granulocytes 0.01 0.00 - 0.07 K/uL  Comprehensive metabolic panel  Result Value Ref Range   Sodium 136 135 - 145 mmol/L   Potassium 4.4 3.5 - 5.1 mmol/L   Chloride 105 98 - 111 mmol/L   CO2 25 22 - 32 mmol/L   Glucose, Bld 92  70 - 99 mg/dL   BUN 10 6 - 20 mg/dL   Creatinine, Ser 0.90 0.44 - 1.00 mg/dL   Calcium 8.8 (L) 8.9 - 10.3 mg/dL   Total Protein 7.1 6.5 - 8.1 g/dL   Albumin 3.8 3.5 - 5.0 g/dL   AST 19 15 - 41 U/L   ALT 12 0 - 44 U/L   Alkaline Phosphatase 55 38 - 126 U/L   Total Bilirubin 0.6 0.3 - 1.2 mg/dL   GFR, Estimated >60 >60 mL/min   Anion gap 6 5 - 15  Lipase, blood  Result Value Ref Range   Lipase 45 11 - 51 U/L  I-Stat Beta hCG blood, ED ([facility], [facility], [facility] only)  Result Value Ref Range   I-stat hCG, quantitative <5.0 <5 mIU/mL   Comment 3          Troponin I (High Sensitivity)  Result Value Ref Range   Troponin I (High Sensitivity) <2 <18 ng/L  Troponin I (High Sensitivity)  Result Value Ref Range   Troponin I (High Sensitivity) <2 <18 ng/L   DG ABD ACUTE 2+V W 1V CHEST  Result Date: 07/31/2020 CLINICAL DATA:  Shortness of breath with nausea and indigestion. EXAM: DG ABDOMEN ACUTE WITH 1 VIEW CHEST COMPARISON:  None. FINDINGS: There is no evidence of dilated bowel loops or free intraperitoneal air. No radiopaque calculi are seen. A small radiopaque BB is seen overlying the right iliac bone. Radiopaque surgical clips are seen overlying the right upper quadrant. An IUD is in place. Heart size and mediastinal contours are within normal limits. Both lungs are clear. IMPRESSION: Negative abdominal radiographs.  No acute cardiopulmonary disease. Electronically Signed   By: Virgina Norfolk M.D.   On: 07/31/2020 01:54    EKG See muse  Radiology DG ABD ACUTE 2+V W 1V CHEST  Result Date: 07/31/2020 CLINICAL DATA:  Shortness of breath with nausea and indigestion.  EXAM: DG ABDOMEN ACUTE WITH 1 VIEW CHEST COMPARISON:  None. FINDINGS: There is no evidence of dilated bowel loops or free intraperitoneal air. No radiopaque calculi are seen. A small radiopaque BB is seen overlying the right iliac bone. Radiopaque surgical clips are seen overlying the right upper quadrant. An IUD is in place. Heart size and mediastinal contours are within normal limits. Both lungs are clear. IMPRESSION: Negative abdominal radiographs.  No acute cardiopulmonary disease. Electronically Signed   By: Virgina Norfolk M.D.   On: 07/31/2020 01:54    Procedures Procedures   Medications Ordered in ED Medications  ondansetron (ZOFRAN-ODT) disintegrating tablet 8 mg (8 mg Oral Given 07/31/20 0309)  naproxen (NAPROSYN) tablet 500 mg (500 mg Oral Given 07/31/20 0309)  alum & mag hydroxide-simeth (MAALOX/MYLANTA) 200-200-20 MG/5ML suspension 30 mL (30 mLs Oral Given 07/31/20 0308)  dicyclomine (BENTYL) capsule 10 mg (10 mg Oral Given 07/31/20 0309)    ED Course  I have reviewed the triage vital signs and the nursing notes.  Pertinent labs & imaging results that were available during my care of the patient were reviewed by me and considered in my medical decision making (see chart for details).    PERC negative, wells 0 highly doubt PE in this low risk patient.  Ruled out for MI in the ED, heart score is 1 very low risk for MACE.  Cramping is a known side effect of the the magnesium citrate.  Vitals, exam and labs are benign and reassuring.  I do not believe pain has a surgical or infectious cause of her  abdominal pain and I do not believe patient needs advanced imaging at this time.  Sandra Walker was evaluated in Emergency Department on 07/31/2020 for the symptoms described in the history of present illness. She was evaluated in the context of the global COVID-19 pandemic, which necessitated consideration that the patient might be at risk for infection with the SARS-CoV-2 virus that causes  COVID-19. Institutional protocols and algorithms that pertain to the evaluation of patients at risk for COVID-19 are in a state of rapid change based on information released by regulatory bodies including the CDC and federal and state organizations. These policies and algorithms were followed during the patient's care in the ED.  Final Clinical Impression(s) / ED Diagnoses  Return for intractable cough, coughing up blood, fevers >100.4 unrelieved by medication, shortness of breath, intractable vomiting, chest pain, shortness of breath, weakness, numbness, changes in speech, facial asymmetry, abdominal pain, passing out, Inability to tolerate liquids or food, cough, altered mental status or any concerns. No signs of systemic illness or infection. The patient is nontoxic-appearing on exam and vital signs are within normal limits.  I have reviewed the triage vital signs and the nursing notes. Pertinent labs & imaging results that were available during my care of the patient were reviewed by me and considered in my medical decision making (see chart for details). After history, exam, and medical workup I feel the patient has been appropriately medically screened and is safe for discharge home. Pertinent diagnoses were discussed with the patient. Patient was given return precautions.     Yi Haugan, MD 07/31/20 [phone]

## 2020-07-31 NOTE — ED Provider Notes (Signed)
MSE was initiated and I personally evaluated the patient and placed orders (if any) at  12:11 AM on Jul 31, 2020.  Patient presents to the emergency department complaining of shortness of breath and abdominal pain.  She states the symptoms started 3 to 4 days ago.  She reports associated abdominal cramps.  She states that she is not pregnant.  Denies any cough or fever.  Denies any known COVID exposures.  Alert and oriented x4 Patient has no focal abdominal tenderness on exam Equal breath sounds, lungs are clear No respiratory distress  Discussed with patient that their care has been initiated.   They are counseled that they will need to remain in the ED until the completion of their workup, including full H&P and results of any tests.  Risks of leaving the emergency department prior to completion of treatment were discussed. Patient was advised to inform ED staff if they are leaving before their treatment is complete. The patient acknowledged these risks and time was allowed for questions.    The patient appears stable so that the remainder of the MSE may be completed by another provider.    Montine Circle, PA-C 07/31/20 Jen Mow    Ezequiel Essex, MD 07/31/20 [phone]

## 2020-07-31 NOTE — ED Notes (Signed)
Patient verbalizes understanding of discharge instructions. Prescriptions reviewed. Opportunity for questioning and answers were provided. Armband removed by staff, pt discharged from ED ambulatory.

## 2020-09-04 ENCOUNTER — Other Ambulatory Visit: Payer: Self-pay

## 2020-09-04 ENCOUNTER — Encounter: Payer: Self-pay | Admitting: Plastic Surgery

## 2020-09-04 ENCOUNTER — Ambulatory Visit (INDEPENDENT_AMBULATORY_CARE_PROVIDER_SITE_OTHER): Payer: Self-pay | Admitting: Plastic Surgery

## 2020-09-04 DIAGNOSIS — D1800 Hemangioma unspecified site: Secondary | ICD-10-CM

## 2020-09-04 NOTE — Progress Notes (Signed)
  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 Consent was confirmed and signed. Time out was called and all information was confirmed to be correct. The area  area was prepped with alcohol and wiped dry. The Nd YAG laser was used with low settings at 90.  The lower lip angioma was lasered. The patient tolerated the procedure well and there were no complications. The patient is to follow up in 4 weeks.

## 2020-09-05 ENCOUNTER — Telehealth: Payer: Self-pay | Admitting: Surgical

## 2020-09-05 NOTE — Telephone Encounter (Signed)
Patient called, concerned about laser to lip that was performed yesterday. Indicated that the lip area is swollen and she has some sensitivity with trying to eat. Per [facility] and Dr. Claudia Desanctis, patient can apply cold compress/ice to the area. If symptoms worsen, such as difficulty swallowing or breathing, patient is advised to seek immediate medical attention/proceed to ED. If she has any other questions, please call our office. Patient voiced understanding and agreement.

## 2020-09-21 ENCOUNTER — Emergency Department (HOSPITAL_COMMUNITY): Payer: Managed Care, Other (non HMO)

## 2020-09-21 ENCOUNTER — Other Ambulatory Visit: Payer: Self-pay

## 2020-09-21 ENCOUNTER — Emergency Department (HOSPITAL_COMMUNITY)
Admission: EM | Admit: 2020-09-21 | Discharge: 2020-09-22 | Disposition: A | Discharge status: Home / Self Care | Payer: Managed Care, Other (non HMO) | Attending: Emergency Medicine | Admitting: Emergency Medicine

## 2020-09-21 DIAGNOSIS — R000 Tachycardia, unspecified: Secondary | ICD-10-CM | POA: Insufficient documentation

## 2020-09-21 DIAGNOSIS — U071 COVID-19: Secondary | ICD-10-CM | POA: Diagnosis not present

## 2020-09-21 DIAGNOSIS — Z91040 Latex allergy status: Secondary | ICD-10-CM | POA: Insufficient documentation

## 2020-09-21 DIAGNOSIS — R509 Fever, unspecified: Secondary | ICD-10-CM | POA: Diagnosis present

## 2020-09-21 LAB — URINALYSIS, ROUTINE W REFLEX MICROSCOPIC
Bilirubin Urine: NEGATIVE
Glucose, UA: NEGATIVE mg/dL
Hgb urine dipstick: NEGATIVE
Ketones, ur: NEGATIVE mg/dL
Leukocytes,Ua: NEGATIVE
Nitrite: NEGATIVE
Protein, ur: NEGATIVE mg/dL
Specific Gravity, Urine: 1.005 (ref 1.005–1.030)
pH: 7 (ref 5.0–8.0)

## 2020-09-21 LAB — BASIC METABOLIC PANEL
Anion gap: 7 (ref 5–15)
BUN: 6 mg/dL (ref 6–20)
CO2: 25 mmol/L (ref 22–32)
Calcium: 8.8 mg/dL — ABNORMAL LOW (ref 8.9–10.3)
Chloride: 104 mmol/L (ref 98–111)
Creatinine, Ser: 0.84 mg/dL (ref 0.44–1.00)
GFR, Estimated: >60 mL/min (ref >60)
Glucose, Bld: 91 mg/dL (ref 70–99)
Potassium: 3.6 mmol/L (ref 3.5–5.1)
Sodium: 136 mmol/L (ref 135–145)

## 2020-09-21 LAB — CBC WITH DIFFERENTIAL/PLATELET
Abs Immature Granulocytes: 0.01 10*3/uL (ref 0.00–0.07)
Basophils Absolute: 0 10*3/uL (ref 0.0–0.1)
Basophils Relative: 1 %
Eosinophils Absolute: 0.2 10*3/uL (ref 0.0–0.5)
Eosinophils Relative: 4 %
HCT: 37.2 % (ref 36.0–46.0)
Hemoglobin: 12.5 g/dL (ref 12.0–15.0)
Immature Granulocytes: 0 %
Lymphocytes Relative: 20 %
Lymphs Abs: 0.8 10*3/uL (ref 0.7–4.0)
MCH: 31.4 pg (ref 26.0–34.0)
MCHC: 33.6 g/dL (ref 30.0–36.0)
MCV: 93.5 fL (ref 80.0–100.0)
Monocytes Absolute: 0.7 10*3/uL (ref 0.1–1.0)
Monocytes Relative: 18 %
Neutro Abs: 2.2 10*3/uL (ref 1.7–7.7)
Neutrophils Relative %: 57 %
Platelets: 174 10*3/uL (ref 150–400)
RBC: 3.98 MIL/uL (ref 3.87–5.11)
RDW: 13 % (ref 11.5–15.5)
WBC: 3.8 10*3/uL — ABNORMAL LOW (ref 4.0–10.5)
nRBC: 0 % (ref 0.0–0.2)

## 2020-09-21 LAB — RESP PANEL BY RT-PCR (FLU A&B, COVID) ARPGX2
Influenza A by PCR: NEGATIVE
Influenza B by PCR: NEGATIVE
SARS Coronavirus 2 by RT PCR: POSITIVE — AB

## 2020-09-21 LAB — POC URINE PREG, ED: Preg Test, Ur: NEGATIVE

## 2020-09-21 MED ORDER — ALBUTEROL SULFATE HFA 108 (90 BASE) MCG/ACT IN AERS: 1.0000 | INHALATION_SPRAY | Freq: Four times a day (QID) | RESPIRATORY_TRACT | 0 refills | Status: DC | PRN

## 2020-09-21 MED ORDER — SODIUM CHLORIDE 0.9 % IV BOLUS
500.0000 mL | Freq: Once | INTRAVENOUS | Status: AC
  Administered 2020-09-21: 500 mL via INTRAVENOUS

## 2020-09-21 MED ORDER — ACETAMINOPHEN 325 MG PO TABS
650.0000 mg | ORAL_TABLET | Freq: Once | ORAL | Status: AC
  Administered 2020-09-21: 650 mg via ORAL
  Filled 2020-09-21: qty 2

## 2020-09-21 MED ORDER — MUCUS RELIEF 600 MG PO TB12: 600.0000 mg | ORAL_TABLET | Freq: Two times a day (BID) | ORAL | 0 refills | Status: DC | PRN

## 2020-09-21 NOTE — ED Triage Notes (Signed)
Pt feeling poorly/achy, HA x1wk, fever since yesterday. 104.5 approx 1930. Tylenol taken 1400, states hasn't helped fever. Endorses pain urinating but drinking water has helped.

## 2020-09-21 NOTE — ED Triage Notes (Signed)
Pt states she has 46yr IUD put in this year

## 2020-09-21 NOTE — ED Provider Notes (Signed)
[place] EMERGENCY DEPARTMENT Provider Note   CSN: [id number] Arrival date & time: 09/21/20  2007     History Chief Complaint  Patient presents with   bodyaches   Fever    Sandra Walker is a 40 y.o. female.  HPI     40 year old female comes in a chief complaint of body aches, fever. Patient reports that she started getting sick about 5 to 6 days ago.  She is having body aches, chills, cough, shortness of breath and fevers.  She got alarmed today because her fevers are not responding to Tylenol.  She took 1 Tylenol today and 1 Tylenol yesterday.  Patient also reports loss of taste and reduced p.o. intake.  She recently went to [place].  She is vaccinated against COVID-19 but has not taken booster.  No history of lung disease, heart disease.  No past medical history on file.  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     Family History  Problem Relation Age of Onset   Healthy Mother    Heart Problems Father    Diabetes Father     Social History   Tobacco Use   Smoking status: Never   Smokeless tobacco: Never  Substance Use Topics   Alcohol use: Yes   Drug use: Not Currently    Home Medications Prior to Admission medications   Medication Sig Start Date End Date Taking? Authorizing Provider  albuterol (VENTOLIN HFA) 108 (90 Base) MCG/ACT inhaler Inhale 1-2 puffs into the lungs every 6 (six) hours as needed for wheezing or shortness of breath. 09/21/20  Yes Jahmil Macleod, MD  guaiFENesin (MUCUS RELIEF) 600 MG 12 hr tablet Take 1 tablet (600 mg total) by mouth 2 (two) times daily as needed. 09/21/20  Yes Varney Biles, MD  azelastine (OPTIVAR) 0.05 % ophthalmic solution Place 1 drop into the right eye 2 (two) times daily. Patient not taking: Reported on 07/31/2020 01/17/20    Volney American, PA-C  dicyclomine (BENTYL) 20 MG tablet Take 1 tablet (20 mg total) by mouth 2 (two) times daily. 07/31/20   Palumbo, April, MD  fluticasone (FLONASE) 50 MCG/ACT nasal spray Place 1 spray into both nostrils 2 (two) times daily. Patient not taking: Reported on 07/31/2020 01/17/20   Volney American, PA-C  levocetirizine (XYZAL) 5 MG tablet Take 1 tablet (5 mg total) by mouth every evening. Patient not taking: Reported on 07/31/2020 01/17/20   Volney American, PA-C  montelukast (SINGULAIR) 10 MG tablet Take 1 tablet (10 mg total) by mouth at bedtime. Patient not taking: Reported on 07/31/2020 01/17/20   Volney American, PA-C  omeprazole (PRILOSEC) 20 MG capsule Take 1 capsule (20 mg total) by mouth 2 (two) times daily before a meal. 07/31/20   Palumbo, April, MD  permethrin (ELIMITE) 5 % cream Apply from neck down before bed then wash off in the morning. May repeat in one week. Patient not taking: Reported on 07/31/2020 09/22/19   Vanessa Kick, MD  predniSONE (DELTASONE) 10 MG tablet Take 6 tabs day one, 5 tabs day two, 4 tabs day three, etc Patient not taking: Reported on 07/31/2020 01/17/20   Volney American, PA-C  SUMAtriptan (IMITREX) 100 MG tablet Take 1 tablet (100 mg total) by mouth every 2 (two) hours  as needed for migraine. May repeat in 2 hours if headache persists or recurs. Patient not taking: Reported on 07/31/2020 02/18/20   Drenda Freeze, MD    Allergies    Penicillin g, Latex, Penicillins, and Shellfish allergy  Review of Systems   Review of Systems  Constitutional:  Positive for activity change and fever.  Respiratory:  Positive for cough and shortness of breath.   Cardiovascular:  Negative for chest pain.  Gastrointestinal:  Positive for nausea.  Musculoskeletal:  Positive for myalgias.  Allergic/Immunologic: Negative for immunocompromised state.  All other systems reviewed and are negative.  Physical Exam Updated Vital Signs BP  (!) 110/55   Pulse (!) 102   Temp (!) 103.2 F (39.6 C) (Oral)   Resp (!) 22   Ht 5\' 7"  (1.702 m)   Wt 86.2 kg   SpO2 97%   BMI 29.76 kg/m   Physical Exam Vitals and nursing note reviewed.  Constitutional:      Appearance: She is well-developed.  HENT:     Head: Atraumatic.  Cardiovascular:     Rate and Rhythm: Tachycardia present.  Pulmonary:     Effort: Pulmonary effort is normal. No respiratory distress.  Musculoskeletal:     Cervical back: Normal range of motion and neck supple.  Skin:    General: Skin is warm and dry.  Neurological:     Mental Status: She is alert and oriented to person, place, and time.    ED Results / Procedures / Treatments   Labs (all labs ordered are listed, but only abnormal results are displayed) Labs Reviewed  RESP PANEL BY RT-PCR (FLU A&B, COVID) ARPGX2 - Abnormal; Notable for the following components:      Result Value   SARS Coronavirus 2 by RT PCR POSITIVE (*)    All other components within normal limits  CBC WITH DIFFERENTIAL/PLATELET - Abnormal; Notable for the following components:   WBC 3.8 (*)    All other components within normal limits  BASIC METABOLIC PANEL - Abnormal; Notable for the following components:   Calcium 8.8 (*)    All other components within normal limits  URINE CULTURE  URINALYSIS, ROUTINE W REFLEX MICROSCOPIC  POC URINE PREG, ED  POC SARS CORONAVIRUS 2 AG -  ED    EKG None  Radiology DG Chest Portable 1 View  Result Date: 09/21/2020 CLINICAL DATA:  Cough and fever. EXAM: PORTABLE CHEST 1 VIEW COMPARISON:  Jul 31, 2020. FINDINGS: Trachea midline. Cardiomediastinal contours and hilar structures are stable. Lung volumes are diminished compared to the prior study. Retrocardiac airspace disease is noted. No pneumothorax. The no effusion on frontal radiograph. Mild interstitial prominence at the lung bases. On limited assessment no acute skeletal process. IMPRESSION: Retrocardiac airspace disease and interstitial  prominence at the lung bases, findings could be seen in the setting of developing infection. Electronically Signed   By: Zetta Bills M.D.   On: 09/21/2020 21:14    Procedures Procedures   Medications Ordered in ED Medications  acetaminophen (TYLENOL) tablet 650 mg (650 mg Oral Given 09/21/20 2209)  sodium chloride 0.9 % bolus 500 mL (500 mLs Intravenous New Bag/Given 09/21/20 2217)    ED Course  I have reviewed the triage vital signs and the nursing notes.  Pertinent labs & imaging results that were available during my care of the patient were reviewed by me and considered in my medical decision making (see chart for details).    MDM Rules/Calculators/A&P  40 year old comes in w/ chief complaint of fevers, body aches and URI-like symptoms. High suspicion for COVID-19. She reports that her fevers are not responding to Tylenol but she is only taken 2 tablets in the last 2 days.  Advised that she takes Tylenol every 6 hours for fever control if needed.  Tylenol ordered now.  Does not appear profoundly dry, but does indicate reduced p.o. intake.  Labs ordered to ensure there is no metabolic derangement.   Sandra Walker was evaluated in Emergency Department on 09/21/2020 for the symptoms described in the history of present illness. She was evaluated in the context of the global COVID-19 pandemic, which necessitated consideration that the patient might be at risk for infection with the SARS-CoV-2 virus that causes COVID-19. Institutional protocols and algorithms that pertain to the evaluation of patients at risk for COVID-19 are in a state of rapid change based on information released by regulatory bodies including the CDC and federal and state organizations. These policies and algorithms were followed during the patient's care in the ED.    Final Clinical Impression(s) / ED Diagnoses Final diagnoses:  COVID-19    Rx / DC Orders ED Discharge Orders           Ordered    guaiFENesin (MUCUS RELIEF) 600 MG 12 hr tablet  2 times daily PRN        09/21/20 2334    albuterol (VENTOLIN HFA) 108 (90 Base) MCG/ACT inhaler  Every 6 hours PRN        09/21/20 2334             Varney Biles, MD 09/21/20 2336

## 2020-09-21 NOTE — ED Notes (Signed)
Pt reports she last had tylenol at 1400 today.

## 2020-09-21 NOTE — Discharge Instructions (Addendum)
You have COVID-19 - take tylenol every 6 hours and you can take motrin in between. Hydrate well. All your labs are reassuring.  See your doctor in 1 week.

## 2020-09-21 NOTE — ED Provider Notes (Signed)
Emergency Medicine Provider Triage Evaluation Note  Sandra Walker , a 40 y.o. female  was evaluated in triage.  Pt complains of fevers T-max at home was 104.5 F.  She reports that for the past week she has been experiencing significant fevers at home, diminished appetite, headache, cough, diminished appetite, nausea, and dysuria.  She endorses suprapubic abdominal region discomfort and is concerned for UTI.    She is actively coughing on my exam.  She states that her body hurts all over.  She has been trying Tylenol and ibuprofen, without any significant relief.  She adamantly denies any sick contacts.  She is immunized for COVID-19, but without booster.  Review of Systems  Positive: Cold symptoms, urinary symptoms. Negative: Sick contacts.  Physical Exam  BP 131/85 (BP Location: Left Arm)   Pulse (!) 121   Temp (!) 103.2 F (39.6 C) (Oral)   Resp (!) 22   SpO2 99%  Gen:   Awake, no distress   Resp:  Normal effort  MSK:   Moves extremities without difficulty  Other:    Medical Decision Making  Medically screening exam initiated at 8:24 PM.  Appropriate orders placed.  Sandra Walker was informed that the remainder of the evaluation will be completed by another provider, this initial triage assessment does not replace that evaluation, and the importance of remaining in the ED until their evaluation is complete.  Suspect viral illness, but given the dysuria and suprapubic region abdominal discomfort/tenderness, will also obtain basic laboratory work-up and UA to evaluate for concurrent UTI.  Chest x-ray given chronicity of cough.   Corena Herter, PA-C 09/21/20 [place], [place], DO 09/21/20 2057

## 2020-09-24 LAB — URINE CULTURE: Culture: >=100000 — AB

## 2020-09-25 ENCOUNTER — Telehealth: Payer: Self-pay | Admitting: Emergency Medicine

## 2020-09-25 NOTE — Telephone Encounter (Signed)
Post ED Visit - Positive Culture Follow-up  Culture report reviewed by antimicrobial stewardship pharmacist: [place] Team []  Elenor Quinones, Pharm.D. []  Heide Guile, Pharm.D., BCPS AQ-ID []  Parks Neptune, Pharm.D., BCPS []  Alycia Rossetti, Pharm.D., BCPS []  [place], [place].D., BCPS, AAHIVP []  Legrand Como, Pharm.D., BCPS, AAHIVP []  Salome Arnt, PharmD, BCPS []  Johnnette Gourd, PharmD, BCPS []  Hughes Better, PharmD, BCPS []  Leeroy Cha, PharmD []  Laqueta Linden, PharmD, BCPS []  Albertina Parr, PharmD  [place] Team []  Leodis Sias, PharmD []  Lindell Spar, PharmD []  Royetta Asal, PharmD []  Graylin Shiver, Rph []  Rema Fendt) Glennon Mac, PharmD []  Arlyn Dunning, PharmD []  Netta Cedars, PharmD []  Dia Sitter, PharmD []  Leone Haven, PharmD []  Gretta Arab, PharmD []  Theodis Shove, PharmD []  Peggyann Juba, PharmD []  Reuel Boom, PharmD   Positive urine culture Treated with none, asymptomatic, no further patient follow-up is required at this time.  Hazle Nordmann 09/25/2020, 3:43 PM

## 2020-10-02 ENCOUNTER — Other Ambulatory Visit: Payer: Self-pay

## 2020-10-02 ENCOUNTER — Ambulatory Visit (INDEPENDENT_AMBULATORY_CARE_PROVIDER_SITE_OTHER): Payer: Self-pay | Admitting: Plastic Surgery

## 2020-10-02 DIAGNOSIS — D1800 Hemangioma unspecified site: Secondary | ICD-10-CM

## 2020-10-02 NOTE — Progress Notes (Signed)
Preoperative Dx: Angioma of lower lip  Postoperative Dx:  same  Procedure: laser to lower lip  Anesthesia: none  Description of Procedure:  Risks and complications were explained to the patient. Consent was confirmed and signed. Time out was called and all information was confirmed to be correct. The area  area was prepped with alcohol and wiped dry. The Nd YAG laser was set at 90. The lip was lasered. The patient tolerated the procedure well and there were no complications. The patient is to follow up in 4 weeks.

## 2020-10-30 ENCOUNTER — Other Ambulatory Visit: Payer: 59 | Admitting: Plastic Surgery

## 2020-12-15 ENCOUNTER — Other Ambulatory Visit: Payer: Self-pay

## 2020-12-15 ENCOUNTER — Encounter (HOSPITAL_COMMUNITY): Payer: Self-pay | Admitting: *Deleted

## 2020-12-15 ENCOUNTER — Ambulatory Visit (HOSPITAL_COMMUNITY)
Admission: EM | Admit: 2020-12-15 | Discharge: 2020-12-15 | Disposition: A | Discharge status: Home / Self Care | Payer: Self-pay | Attending: Internal Medicine | Admitting: Internal Medicine

## 2020-12-15 DIAGNOSIS — N3090 Cystitis, unspecified without hematuria: Secondary | ICD-10-CM | POA: Diagnosis not present

## 2020-12-15 LAB — POCT URINALYSIS DIPSTICK, ED / UC
Bilirubin Urine: NEGATIVE
Glucose, UA: NEGATIVE mg/dL
Ketones, ur: NEGATIVE mg/dL
Nitrite: NEGATIVE
Protein, ur: NEGATIVE mg/dL
Specific Gravity, Urine: >=1.03 (ref 1.005–1.030)
Urobilinogen, UA: 1 mg/dL (ref 0.0–1.0)
pH: 6 (ref 5.0–8.0)

## 2020-12-15 MED ORDER — NITROFURANTOIN MONOHYD MACRO 100 MG PO CAPS: 100.0000 mg | ORAL_CAPSULE | Freq: Two times a day (BID) | ORAL | 0 refills | Status: DC

## 2020-12-15 NOTE — ED Triage Notes (Signed)
Pt reports Sx's UTI with pressure and dysuria. This started today.

## 2020-12-15 NOTE — ED Provider Notes (Signed)
[facility]    CSN: [id number] Arrival date & time: 12/15/20  1610      History   Chief Complaint Chief Complaint  Patient presents with   Dysuria    HPI Sandra Walker is a 40 y.o. female.   Patient here today for evaluation of dysuria, urinary urgency, and lower abdominal pressure that started today.  She reports the symptoms are similar to prior UTIs.  She has not had any fever or chills.  She denies any nausea or vomiting.  She has not had any back pain.  Does admit to taking 1 doxycycline tablet around 3 PM today.   Dysuria Associated symptoms: abdominal pain   Associated symptoms: no fever, no nausea and no vomiting    History reviewed. No pertinent past medical history.  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      Home Medications    Prior to Admission medications   Medication Sig Start Date End Date Taking? Authorizing Provider  nitrofurantoin, macrocrystal-monohydrate, (MACROBID) 100 MG capsule Take 1 capsule (100 mg total) by mouth 2 (two) times daily. 12/15/20  Yes Francene Finders, PA-C    Family History Family History  Problem Relation Age of Onset   Healthy Mother    Heart Problems Father    Diabetes Father     Social History Social History   Tobacco Use   Smoking status: Never   Smokeless tobacco: Never  Substance Use Topics   Alcohol use: Yes   Drug use: Not Currently     Allergies   Penicillin g, Latex, Penicillins, and Shellfish allergy   Review of Systems Review of Systems  Constitutional:  Negative for chills and fever.  Respiratory:  Negative for shortness of breath.   Gastrointestinal:  Positive for abdominal pain. Negative for nausea and vomiting.  Genitourinary:  Positive for dysuria, frequency and urgency.  Musculoskeletal:  Negative for back  pain.    Physical Exam Triage Vital Signs ED Triage Vitals  Enc Vitals Group     BP 12/15/20 1631 120/82     Pulse Rate 12/15/20 1631 85     Resp --      Temp 12/15/20 1631 98.8 F (37.1 C)     Temp Source 12/15/20 1631 Oral     SpO2 12/15/20 1631 98 %     Weight --      Height --      Head Circumference --      Peak Flow --      Pain Score 12/15/20 1644 9     Pain Loc --      Pain Edu? --      Excl. in [place]? --    No data found.  Updated Vital Signs BP 120/82 (BP Location: Right Arm)   Pulse 85   Temp 98.8 F (37.1 C) (Oral)   SpO2 98%      Physical Exam Vitals and nursing note reviewed.  Constitutional:      General: She is not in acute distress.    Appearance: Normal appearance. She is not ill-appearing.  HENT:     Head: Normocephalic and atraumatic.     Nose: Nose normal.  Cardiovascular:     Rate and Rhythm: Normal rate and regular rhythm.     Heart sounds: Normal  heart sounds. No murmur heard. Pulmonary:     Effort: Pulmonary effort is normal. No respiratory distress.     Breath sounds: Normal breath sounds. No wheezing, rhonchi or rales.  Abdominal:     General: Abdomen is flat. Bowel sounds are normal. There is no distension.     Palpations: Abdomen is soft.     Tenderness: There is abdominal tenderness (mild suprapubic "pressure"). There is no right CVA tenderness, left CVA tenderness or guarding.  Skin:    General: Skin is warm and dry.  Neurological:     Mental Status: She is alert.  Psychiatric:        Mood and Affect: Mood normal.        Thought Content: Thought content normal.     UC Treatments / Results  Labs (all labs ordered are listed, but only abnormal results are displayed) Labs Reviewed  POCT URINALYSIS DIPSTICK, ED / UC - Abnormal; Notable for the following components:      Result Value   Hgb urine dipstick LARGE (*)    Leukocytes,Ua MODERATE (*)    All other components within normal limits  URINE CULTURE     EKG   Radiology No results found.  Procedures Procedures (including critical care time)  Medications Ordered in UC Medications - No data to display  Initial Impression / Assessment and Plan / UC Course  I have reviewed the triage vital signs and the nursing notes.  Pertinent labs & imaging results that were available during my care of the patient were reviewed by me and considered in my medical decision making (see chart for details).  Macrobid prescribed for suspected UTI.  Urine culture ordered.  Encouraged follow-up if symptoms fail to improve or worsen.  Final Clinical Impressions(s) / UC Diagnoses   Final diagnoses:  Cystitis     Discharge Instructions      Take antibiotic as prescribed.  Follow-up with any further concerns.     ED Prescriptions     Medication Sig Dispense Auth. Provider   nitrofurantoin, macrocrystal-monohydrate, (MACROBID) 100 MG capsule Take 1 capsule (100 mg total) by mouth 2 (two) times daily. 10 capsule Francene Finders, PA-C      PDMP not reviewed this encounter.   Francene Finders, PA-C 12/15/20 1708

## 2020-12-15 NOTE — Discharge Instructions (Addendum)
Take antibiotic as prescribed.  Follow-up with any further concerns.

## 2020-12-16 LAB — URINE CULTURE: Culture: <10000 — AB

## 2021-11-10 ENCOUNTER — Encounter (HOSPITAL_COMMUNITY): Payer: Self-pay | Admitting: Emergency Medicine

## 2021-11-10 ENCOUNTER — Emergency Department (HOSPITAL_COMMUNITY)
Admission: EM | Admit: 2021-11-10 | Discharge: 2021-11-10 | Disposition: A | Discharge status: Home / Self Care | Payer: Medicaid Other | Attending: Emergency Medicine | Admitting: Emergency Medicine

## 2021-11-10 DIAGNOSIS — U071 COVID-19: Secondary | ICD-10-CM | POA: Diagnosis not present

## 2021-11-10 DIAGNOSIS — Z91040 Latex allergy status: Secondary | ICD-10-CM | POA: Insufficient documentation

## 2021-11-10 LAB — GROUP A STREP BY PCR: Group A Strep by PCR: NOT DETECTED

## 2021-11-10 LAB — SARS CORONAVIRUS 2 BY RT PCR: SARS Coronavirus 2 by RT PCR: POSITIVE — AB

## 2021-11-10 MED ORDER — KETOROLAC TROMETHAMINE 15 MG/ML IJ SOLN
30.0000 mg | Freq: Once | INTRAMUSCULAR | Status: AC
  Administered 2021-11-10: 30 mg via INTRAMUSCULAR
  Filled 2021-11-10: qty 2

## 2021-11-10 NOTE — ED Triage Notes (Signed)
Patient here with complaint of progressively worsening fatigue, sore throat, and headache that started Tuesday this week. Afebrile triage, denies cough.

## 2021-11-10 NOTE — ED Provider Notes (Signed)
[facility] EMERGENC[facility] DEPARTMENT Provider Note   CSN: [id number] Arrival date & time: 11/10/21  1040     History  Chief Complaint  Patient presents with   Sore Throat    Sandra Walker is a 41 y.o. female.  Patient presents to the emergency department today for evaluation of sore throat, fatigue, headache starting 5 to 6 days ago.  No known sick contacts.  She states that pain is worse with swallowing but she is able to swallow and handle secretions.  No reported fevers or chills.  No ear pain.  No nausea, vomiting, diarrhea.  No cough or chest pain.  Over-the-counter medications have not been helping.  Pain in her neck is worse with movement.       Home Medications Prior to Admission medications   Medication Sig Start Date End Date Taking? Authorizing Provider  nitrofurantoin, macrocrystal-monohydrate, (MACROBID) 100 MG capsule Take 1 capsule (100 mg total) by mouth 2 (two) times daily. 12/15/20   Francene Finders, PA-C      Allergies    Penicillin g, Latex, Penicillins, and Shellfish allergy    Review of Systems   Review of Systems  Physical Exam Updated Vital Signs BP 136/83 (BP Location: Right Arm)   Pulse 96   Temp 99.2 F (37.3 C) (Oral)   Resp (!) 24   SpO2 96%   Physical Exam Vitals and nursing note reviewed.  Constitutional:      Appearance: She is well-developed.  HENT:     Head: Normocephalic and atraumatic.     Jaw: No trismus.     Right Ear: Tympanic membrane, ear canal and external ear normal.     Left Ear: Tympanic membrane, ear canal and external ear normal.     Nose: Nose normal. No mucosal edema or rhinorrhea.     Mouth/Throat:     Mouth: Mucous membranes are moist. Mucous membranes are not dry. No oral lesions.     Pharynx: Uvula midline. Posterior oropharyngeal erythema present. No oropharyngeal exudate or uvula swelling.     Tonsils: No tonsillar abscesses.     Comments: Posterior pharyngeal erythema, right seems to be  worse than the left.  No peritonsillar abscess. Eyes:     General:        Right eye: No discharge.        Left eye: No discharge.     Conjunctiva/sclera: Conjunctivae normal.  Neck:     Comments: Full range of motion of neck laterally, and rotation and in flexion/extension. Cardiovascular:     Rate and Rhythm: Normal rate and regular rhythm.     Heart sounds: Normal heart sounds.  Pulmonary:     Effort: Pulmonary effort is normal. No respiratory distress.     Breath sounds: Normal breath sounds. No wheezing or rales.  Abdominal:     Palpations: Abdomen is soft.     Tenderness: There is no abdominal tenderness.  Musculoskeletal:     Cervical back: Normal range of motion and neck supple.  Lymphadenopathy:     Cervical: No cervical adenopathy.  Skin:    General: Skin is warm and dry.  Neurological:     Mental Status: She is alert.  Psychiatric:        Mood and Affect: Mood normal.     ED Results / Procedures / Treatments   Labs (all labs ordered are listed, but only abnormal results are displayed) Labs Reviewed  SARS CORONAVIRUS 2 BY RT PCR - Abnormal; Notable  for the following components:      Result Value   SARS Coronavirus 2 by RT PCR POSITIVE (*)    All other components within normal limits  GROUP A STREP BY PCR    EKG None  Radiology No results found.  Procedures Procedures    Medications Ordered in ED Medications  ketorolac (TORADOL) 15 MG/ML injection 30 mg (30 mg Intramuscular Given 11/10/21 1225)    ED Course/ Medical Decision Making/ A&P    Patient seen and examined. History obtained directly from patient.   Labs/EKG: Ordered COVID testing, strep test.  Imaging: None ordered  Medications/Fluids: IM Toradol.  Most recent vital signs reviewed and are as follows: BP 136/83 (BP Location: Right Arm)   Pulse 96   Temp 99.2 F (37.3 C) (Oral)   Resp (!) 24   SpO2 96%   Initial impression: Sore throat, headache.  No signs of meningitis or  fever.  No signs of peritonsillar abscess or suggestion of deep space infection in the neck.  12:50 PM Reassessment performed. Patient appears stable. About to get IM toradol.   Labs personally reviewed and interpreted including: COVID-positive, strep negative.  Reviewed pertinent lab work and imaging with patient at bedside. Questions answered.   Most current vital signs reviewed and are as follows: BP 125/76   Pulse 66   Temp 98 F (36.7 C)   Resp 18   SpO2 100%   Plan: Discharge to home.   Prescriptions written for: None  Other home care instructions discussed: Detailed discussion had with with patient regarding COVID-19 precautions and written instructions given as well.  We discussed need to isolate themselves for 5 days from onset of symptoms and have 24 hours of improvement prior to breaking isolation.  We discussed that when breaking isolation, mask wearing for 5 additional days is required.  Counseled on the need for rest and good hydration.   ED return instructions discussed: We discussed signs symptoms to return which include worsening shortness of breath, trouble breathing, or increased work of breathing.  Also return with persistent vomiting, confusion, passing out, or if they have any other concerns.   Follow-up instructions discussed: Patient encouraged to follow-up with their PCP as needed.   Sandra Walker was evaluated in Emergency Department on 11/10/2021 for the symptoms described in the history of present illness. She was evaluated in the context of the global COVID-19 pandemic, which necessitated consideration that the patient might be at risk for infection with the SARS-CoV-2 virus that causes COVID-19. Institutional protocols and algorithms that pertain to the evaluation of patients at risk for COVID-19 are in a state of rapid change based on information released by regulatory bodies including the CDC and federal and state organizations. These policies and algorithms  were followed during the patient's care in the ED.                           Medical Decision Making Risk Prescription drug management.   Patient with URI symptoms, almost for a week now.  Most severe symptom is sore throat.  She is COVID-positive.  Strep negative.  No concern for deep space abscess in neck.  Patient does not appear to be septic.  Symptom control indicated at this time.  She is out of the window for antivirals.        Final Clinical Impression(s) / ED Diagnoses Final diagnoses:  COVID    Rx / DC Orders ED Discharge  Orders     None         Carlisle Cater, PA-C 11/10/21 1252    Charlesetta Shanks, MD 11/17/21 [phone]

## 2021-11-10 NOTE — Discharge Instructions (Signed)
Please read and follow all provided instructions.  Your diagnoses today include:  1. COVID     Tests performed today include: Vital signs. See below for your results today.  COVID test - Positive Strep test - Negative  Medications prescribed:  Please use over-the-counter NSAID medications (ibuprofen, naproxen) as directed on the packaging for pain if you do not have any reasons not to take these medications just as weak kidneys or a history of bleeding in your stomach or gut.   Take any prescribed medications only as directed. Treatment for your infection is aimed at treating the symptoms. There are no medications, such as antibiotics, that will cure your infection.   Home care instructions:  Follow any educational materials contained in this packet.   Your illness is contagious and can be spread to others, especially during the first 3 or 4 days. It cannot be cured by antibiotics or other medicines. Take basic precautions such as washing your hands often, covering your mouth when you cough or sneeze, and avoiding public places where you could spread your illness to others.   Please continue drinking plenty of fluids.  Use over-the-counter medicines as needed as directed on packaging for symptom relief.  You may also use ibuprofen or tylenol as directed on packaging for pain or fever.  Do not take multiple medicines containing Tylenol or acetaminophen to avoid taking too much of this medication.  If you are positive for Covid-19, you should isolate yourself and not be exposed to other people for 5 days after your symptoms began. If you are not feeling better at day 5, you need to isolate yourself for a total of 10 days. If you are feeling better by day 5, you should wear a mask properly, over your nose and mouth, at all times while around other people until 10 days after your symptoms started.   Follow-up instructions: Please follow-up with your primary care provider as needed for further  evaluation of your symptoms if you are not feeling better.   Return instructions:  Please return to the Emergency Department if you experience worsening symptoms.  Return to the emergency department if you have worsening shortness of breath breathing or increased work of breathing, persistent vomiting RETURN IMMEDIATELY IF you develop shortness of breath, confusion or altered mental status, a new rash, become dizzy, faint, or poorly responsive, or are unable to be cared for at home. Please return if you have persistent vomiting and cannot keep down fluids or develop a fever that is not controlled by tylenol or motrin.   Please return if you have any other emergent concerns.  Additional Information:  Your vital signs today were: BP 136/83 (BP Location: Right Arm)   Pulse 96   Temp 99.2 F (37.3 C) (Oral)   Resp (!) 24   SpO2 96%  If your blood pressure (BP) was elevated above 135/85 this visit, please have this repeated by your doctor within one month. --------------

## 2021-12-19 ENCOUNTER — Ambulatory Visit (HOSPITAL_COMMUNITY)
Admission: EM | Admit: 2021-12-19 | Discharge: 2021-12-19 | Disposition: A | Discharge status: Home / Self Care | Payer: Self-pay | Attending: Family Medicine | Admitting: Family Medicine

## 2021-12-19 ENCOUNTER — Encounter (HOSPITAL_COMMUNITY): Payer: Self-pay

## 2021-12-19 DIAGNOSIS — S91205A Unspecified open wound of left lesser toe(s) with damage to nail, initial encounter: Secondary | ICD-10-CM

## 2021-12-19 DIAGNOSIS — S91209A Unspecified open wound of unspecified toe(s) with damage to nail, initial encounter: Secondary | ICD-10-CM

## 2021-12-19 DIAGNOSIS — L03032 Cellulitis of left toe: Secondary | ICD-10-CM

## 2021-12-19 MED ORDER — MUPIROCIN 2 % EX OINT: 1.0000 | TOPICAL_OINTMENT | Freq: Two times a day (BID) | CUTANEOUS | 0 refills | Status: DC

## 2021-12-19 MED ORDER — CLINDAMYCIN HCL 300 MG PO CAPS: 300.0000 mg | ORAL_CAPSULE | Freq: Three times a day (TID) | ORAL | 0 refills | Status: AC

## 2021-12-19 NOTE — Discharge Instructions (Signed)
--  Take clindamycin 300 mg-- 1 capsule 3 times daily for 7 days  Put mupirocin ointment on the sore areas twice daily until improved

## 2021-12-19 NOTE — ED Triage Notes (Signed)
Patient c/o toenail on left great toe coming off. States it is now painful and has an odor.

## 2021-12-19 NOTE — ED Provider Notes (Signed)
[facility]    CSN: [id number] Arrival date & time: 12/19/21  1054      History   Chief Complaint Chief Complaint  Patient presents with   Toe Pain    HPI Maitland Muhlbauer is a 41 y.o. female.    Toe Pain   Here with left great toe pain.  A few days ago she bumped her toe and tore off part of her left great toenail.  Now in the last day or 2 it is hurting worse, and she notes some discharge from under the nail and an odor to that nail.  No fever or chills She is allergic to penicillin which causes swelling and angioedema  Last menstrual cycle was in the last week   History reviewed. No pertinent past medical history.  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      Home Medications    Prior to Admission medications   Medication Sig Start Date End Date Taking? Authorizing Provider  clindamycin (CLEOCIN) 300 MG capsule Take 1 capsule (300 mg total) by mouth 3 (three) times daily for 7 days. 12/19/21 12/26/21 Yes Taniesha Glanz, Gwenlyn Perking, MD  mupirocin ointment (BACTROBAN) 2 % Apply 1 Application topically 2 (two) times daily. To affected area till better 12/19/21  Yes Laquinn Shippy, Gwenlyn Perking, MD    Family History Family History  Problem Relation Age of Onset   Healthy Mother    Heart Problems Father    Diabetes Father     Social History Social History   Tobacco Use   Smoking status: Never   Smokeless tobacco: Never  Substance Use Topics   Alcohol use: Yes   Drug use: Not Currently     Allergies   Penicillin g, Latex, Penicillins, and Shellfish allergy   Review of Systems Review of Systems   Physical Exam Triage Vital Signs ED Triage Vitals  Enc Vitals Group     BP 12/19/21 1205 (!) 131/90     Pulse Rate 12/19/21 1205 91     Resp 12/19/21 1205 18     Temp 12/19/21 1205 98.7 F  (37.1 C)     Temp Source 12/19/21 1205 Oral     SpO2 12/19/21 1205 98 %     Weight --      Height --      Head Circumference --      Peak Flow --      Pain Score 12/19/21 1204 7     Pain Loc --      Pain Edu? --      Excl. in [place]? --    No data found.  Updated Vital Signs BP (!) 131/90 (BP Location: Right Arm)   Pulse 91   Temp 98.7 F (37.1 C) (Oral)   Resp 18   SpO2 98%   Visual Acuity Right Eye Distance:   Left Eye Distance:   Bilateral Distance:    Right Eye Near:   Left Eye Near:    Bilateral Near:     Physical Exam Vitals reviewed.  Constitutional:      General: She is not in acute distress.    Appearance: She is not ill-appearing, toxic-appearing or diaphoretic.  Skin:    Coloration: Skin is not pale.     Comments: The distal half of the  left great toenail is missing.  The proximal portion that remains attached still is loose and can be lifted up from the medial side.  The nailbed is visible in one portion and is pink.  There is a little bit of clear discharge there but no purulence is seen at the time of exam.  There is a little bit of redness and swelling around the proximal and medial nail folds.  Neurological:     General: No focal deficit present.     Mental Status: She is alert and oriented to person, place, and time.  Psychiatric:        Behavior: Behavior normal.      UC Treatments / Results  Labs (all labs ordered are listed, but only abnormal results are displayed) Labs Reviewed - No data to display  EKG   Radiology No results found.  Procedures Procedures (including critical care time)  Medications Ordered in UC Medications - No data to display  Initial Impression / Assessment and Plan / UC Course  I have reviewed the triage vital signs and the nursing notes.  Pertinent labs & imaging results that were available during my care of the patient were reviewed by me and considered in my medical decision making (see chart for  details).        I am going to treat with some clindamycin and Bactroban.  She is given contact information for podiatry.  I think we need to calm down the infection in the tissues, before removal of the rest of the nail was attempted. Final Clinical Impressions(s) / UC Diagnoses   Final diagnoses:  Cellulitis of toe of left foot  Avulsion of toenail, initial encounter     Discharge Instructions      --Take clindamycin 300 mg-- 1 capsule 3 times daily for 7 days  Put mupirocin ointment on the sore areas twice daily until improved       ED Prescriptions     Medication Sig Dispense Auth. Provider   clindamycin (CLEOCIN) 300 MG capsule Take 1 capsule (300 mg total) by mouth 3 (three) times daily for 7 days. 21 capsule Barrett Henle, MD   mupirocin ointment (BACTROBAN) 2 % Apply 1 Application topically 2 (two) times daily. To affected area till better 22 g Windy Carina Gwenlyn Perking, MD      PDMP not reviewed this encounter.   Barrett Henle, MD 12/19/21 [phone]

## 2022-01-13 ENCOUNTER — Ambulatory Visit (HOSPITAL_COMMUNITY)
Admission: EM | Admit: 2022-01-13 | Discharge: 2022-01-13 | Disposition: A | Discharge status: Home / Self Care | Payer: Self-pay | Attending: Physician Assistant | Admitting: Physician Assistant

## 2022-01-13 ENCOUNTER — Encounter (HOSPITAL_COMMUNITY): Payer: Self-pay | Admitting: Emergency Medicine

## 2022-01-13 DIAGNOSIS — N3001 Acute cystitis with hematuria: Secondary | ICD-10-CM | POA: Insufficient documentation

## 2022-01-13 DIAGNOSIS — R300 Dysuria: Secondary | ICD-10-CM | POA: Insufficient documentation

## 2022-01-13 LAB — POCT URINALYSIS DIPSTICK, ED / UC
Bilirubin Urine: NEGATIVE
Glucose, UA: NEGATIVE mg/dL
Nitrite: NEGATIVE
Protein, ur: 30 mg/dL — AB
Specific Gravity, Urine: >=1.03 (ref 1.005–1.030)
Urobilinogen, UA: 0.2 mg/dL (ref 0.0–1.0)
pH: 5.5 (ref 5.0–8.0)

## 2022-01-13 MED ORDER — NITROFURANTOIN MONOHYD MACRO 100 MG PO CAPS: 100.0000 mg | ORAL_CAPSULE | Freq: Two times a day (BID) | ORAL | 0 refills | Status: DC

## 2022-01-13 NOTE — ED Triage Notes (Signed)
Reports dysuria since Thursday. Had some left over Clindamycin and took 4 of those but hasnt helped.

## 2022-01-13 NOTE — ED Provider Notes (Signed)
Sandra Walker    CSN: [id number] Arrival date & time: 01/13/22  1618      History   Chief Complaint Chief Complaint  Patient presents with   Dysuria    HPI Sandra Walker is a 41 y.o. female.   Patient presents today with a 4-day history of UTI symptoms.  Reports dysuria, bladder discomfort, frequency.  Denies any significant hematuria but reports that she is currently on her menstrual cycle so is unsure if she would notice.  She denies any abdominal pain, pelvic pain, fever, nausea, vomiting, vaginal symptoms.  She did have several leftover antibiotics (clindamycin) that she took which provided temporary relief of symptoms only to have symptoms recur and worsen.  Denies additional antibiotics in the past 90 days.  Denies history of recurrent urinary tract infection infection.  Denies history of nephrolithiasis, single kidney, self-catheterization, recent urogenital procedure.  She is confident that she is not pregnant.    History reviewed. No pertinent past medical history.  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      Home Medications    Prior to Admission medications   Medication Sig Start Date End Date Taking? Authorizing Provider  nitrofurantoin, macrocrystal-monohydrate, (MACROBID) 100 MG capsule Take 1 capsule (100 mg total) by mouth 2 (two) times daily. 01/13/22  Yes Jennise Both, Derry Skill, PA-C    Family History Family History  Problem Relation Age of Onset   Healthy Mother    Heart Problems Father    Diabetes Father     Social History Social History   Tobacco Use   Smoking status: Never   Smokeless tobacco: Never  Substance Use Topics   Alcohol use: Yes   Drug use: Not Currently     Allergies   Penicillin g, Latex, Penicillins, and Shellfish allergy   Review of Systems Review  of Systems  Constitutional:  Positive for activity change. Negative for appetite change, fatigue and fever.  Respiratory:  Negative for cough and shortness of breath.   Cardiovascular:  Negative for chest pain.  Gastrointestinal:  Negative for abdominal pain, diarrhea, nausea and vomiting.  Genitourinary:  Positive for dysuria and frequency. Negative for flank pain, hematuria, urgency, vaginal bleeding, vaginal discharge and vaginal pain.     Physical Exam Triage Vital Signs ED Triage Vitals  Enc Vitals Group     BP 01/13/22 1715 121/79     Pulse Rate 01/13/22 1715 100     Resp 01/13/22 1715 17     Temp 01/13/22 1715 97.9 F (36.6 C)     Temp Source 01/13/22 1715 Oral     SpO2 01/13/22 1715 100 %     Weight --      Height --      Head Circumference --      Peak Flow --      Pain Score 01/13/22 1713 8     Pain Loc --      Pain Edu? --      Excl. in [place]? --    No data found.  Updated Vital Signs BP 121/79 (BP Location: Left Arm)   Pulse 100   Temp 97.9 F (36.6 C) (Oral)   Resp 17   LMP 01/10/2022   SpO2 100%   Visual Acuity Right Eye Distance:   Left Eye Distance:  Bilateral Distance:    Right Eye Near:   Left Eye Near:    Bilateral Near:     Physical Exam Vitals reviewed.  Constitutional:      General: She is awake. She is not in acute distress.    Appearance: Normal appearance. She is well-developed. She is not ill-appearing.     Comments: Very pleasant female appears stated age in no acute distress sitting comfortably in exam room  HENT:     Head: Normocephalic and atraumatic.  Cardiovascular:     Rate and Rhythm: Normal rate and regular rhythm.     Heart sounds: Normal heart sounds, S1 normal and S2 normal. No murmur heard. Pulmonary:     Effort: Pulmonary effort is normal.     Breath sounds: Normal breath sounds. No wheezing, rhonchi or rales.     Comments: Clear to auscultation bilaterally Abdominal:     General: Bowel sounds are normal.      Palpations: Abdomen is soft.     Tenderness: There is no abdominal tenderness. There is no right CVA tenderness, left CVA tenderness, guarding or rebound.     Comments: Benign abdominal exam; no tenderness palpation.  No CVA tenderness.  Psychiatric:        Behavior: Behavior is cooperative.      UC Treatments / Results  Labs (all labs ordered are listed, but only abnormal results are displayed) Labs Reviewed  POCT URINALYSIS DIPSTICK, ED / UC - Abnormal; Notable for the following components:      Result Value   Ketones, ur TRACE (*)    Hgb urine dipstick LARGE (*)    Protein, ur 30 (*)    Leukocytes,Ua MODERATE (*)    All other components within normal limits  URINE CULTURE    EKG   Radiology No results found.  Procedures Procedures (including critical care time)  Medications Ordered in UC Medications - No data to display  Initial Impression / Assessment and Plan / UC Course  I have reviewed the triage vital signs and the nursing notes.  Pertinent labs & imaging results that were available during my care of the patient were reviewed by me and considered in my medical decision making (see chart for details).     UA consistent with urinary tract infection.  We will start Macrobid twice daily for 5 days.  Urine culture was obtained and we discussed potential need to change antibiotics based on susceptibilities identified on culture.  She is to rest and drink plenty of fluid.  Can use over-the-counter medication for additional symptom relief.  Discussed that if her symptoms do not improving or if anything worsens she needs to return for reevaluation.  Discussed alarm symptoms that warrant emergent evaluation including fever, worsening abdominal pain/bladder pain, fever, nausea, vomiting, weakness.  Strict return precautions given.  Work excuse note provided.  Final Clinical Impressions(s) / UC Diagnoses   Final diagnoses:  Acute cystitis with hematuria  Dysuria      Discharge Instructions      It appears you have a urinary tract infection.  Please start Macrobid twice daily.  Make sure you are resting and drinking plenty of fluid.  We will contact you if we need to change your antibiotics based on your culture results.  If you have any worsening symptoms including abdominal pain, pelvic pain, fever, nausea, vomiting you need to be seen immediately.     ED Prescriptions     Medication Sig Dispense Auth. Provider   nitrofurantoin, macrocrystal-monohydrate, (  MACROBID) 100 MG capsule Take 1 capsule (100 mg total) by mouth 2 (two) times daily. 10 capsule Arshawn Valdez, Derry Skill, PA-C      PDMP not reviewed this encounter.   Terrilee Croak, PA-C 01/13/22 1751

## 2022-01-13 NOTE — Discharge Instructions (Signed)
It appears you have a urinary tract infection.  Please start Macrobid twice daily.  Make sure you are resting and drinking plenty of fluid.  We will contact you if we need to change your antibiotics based on your culture results.  If you have any worsening symptoms including abdominal pain, pelvic pain, fever, nausea, vomiting you need to be seen immediately.

## 2022-01-14 ENCOUNTER — Ambulatory Visit (HOSPITAL_COMMUNITY): Payer: Medicaid Other

## 2022-01-15 LAB — URINE CULTURE: Culture: 40000 — AB

## 2022-01-24 DIAGNOSIS — Z01419 Encounter for gynecological examination (general) (routine) without abnormal findings: Secondary | ICD-10-CM | POA: Diagnosis not present

## 2022-01-24 DIAGNOSIS — Z6829 Body mass index (BMI) 29.0-29.9, adult: Secondary | ICD-10-CM | POA: Diagnosis not present

## 2022-02-09 ENCOUNTER — Other Ambulatory Visit: Payer: Self-pay

## 2022-02-09 ENCOUNTER — Encounter (HOSPITAL_COMMUNITY): Payer: Self-pay | Admitting: Emergency Medicine

## 2022-02-09 ENCOUNTER — Emergency Department (HOSPITAL_COMMUNITY)
Admission: EM | Admit: 2022-02-09 | Discharge: 2022-02-09 | Disposition: A | Discharge status: Home / Self Care | Payer: BC Managed Care – PPO | Attending: Emergency Medicine | Admitting: Emergency Medicine

## 2022-02-09 DIAGNOSIS — N6310 Unspecified lump in the right breast, unspecified quadrant: Secondary | ICD-10-CM | POA: Diagnosis not present

## 2022-02-09 DIAGNOSIS — N611 Abscess of the breast and nipple: Secondary | ICD-10-CM | POA: Diagnosis not present

## 2022-02-09 DIAGNOSIS — Z91040 Latex allergy status: Secondary | ICD-10-CM | POA: Diagnosis not present

## 2022-02-09 DIAGNOSIS — N644 Mastodynia: Secondary | ICD-10-CM | POA: Diagnosis not present

## 2022-02-09 MED ORDER — HYDROCODONE-ACETAMINOPHEN 5-325 MG PO TABS: 1.0000 | ORAL_TABLET | Freq: Four times a day (QID) | ORAL | 0 refills | Status: DC | PRN

## 2022-02-09 MED ORDER — DOXYCYCLINE HYCLATE 100 MG PO CAPS: 100.0000 mg | ORAL_CAPSULE | Freq: Two times a day (BID) | ORAL | 0 refills | Status: DC

## 2022-02-09 NOTE — Discharge Instructions (Addendum)
Call or go to the [facility] tomorrow for follow up and evaluation of your breast Contact a health care provider if: You have pus-like discharge from the breast. You have a fever. Your symptoms do not improve within 2 days of starting treatment. Get help right away if: Your pain and swelling are getting worse. You have pain that is not controlled with medicine. You have a red line extending from the breast toward your armpit.

## 2022-02-09 NOTE — ED Triage Notes (Signed)
Patient reports right nipple/breast pain with mild swelling onset this morning , denies injury .

## 2022-02-09 NOTE — ED Provider Notes (Signed)
[facility] Provider Note   CSN: [id number] Arrival date & time: 02/09/22  1953     History  Chief Complaint  Patient presents with   Right Breast Pain     Sandra Walker is a 41 y.o. female presents emergency department chief complaint of right breast pain.  Patient states that she noticed some tenderness in her right breast however this evening she developed severe pain behind the areola on her right breast.  No known injuries.  She denies fevers or chills.  HPI     Home Medications Prior to Admission medications   Medication Sig Start Date End Date Taking? Authorizing Provider  nitrofurantoin, macrocrystal-monohydrate, (MACROBID) 100 MG capsule Take 1 capsule (100 mg total) by mouth 2 (two) times daily. 01/13/22   Raspet, Derry Skill, PA-C      Allergies    Penicillin g, Latex, Penicillins, and Shellfish allergy    Review of Systems   Review of Systems  Physical Exam Updated Vital Signs BP 121/83 (BP Location: Left Arm)   Pulse 82   Temp 98.4 F (36.9 C) (Oral)   Resp 18   LMP 02/05/2022   SpO2 100%  Physical Exam Vitals and nursing note reviewed.  Constitutional:      General: She is not in acute distress.    Appearance: She is well-developed. She is not diaphoretic.  HENT:     Head: Normocephalic and atraumatic.     Right Ear: External ear normal.     Left Ear: External ear normal.     Nose: Nose normal.     Mouth/Throat:     Mouth: Mucous membranes are moist.  Eyes:     General: No scleral icterus.    Conjunctiva/sclera: Conjunctivae normal.  Cardiovascular:     Rate and Rhythm: Normal rate and regular rhythm.     Heart sounds: Normal heart sounds. No murmur heard.    No friction rub. No gallop.  Pulmonary:     Effort: Pulmonary effort is normal. No respiratory distress.     Breath sounds: Normal breath sounds.  Chest:       Comments: Painful, tender, firm well-circumscribed lump that is mobile behind the  right areola.  Abdominal:     General: Bowel sounds are normal. There is no distension.     Palpations: Abdomen is soft. There is no mass.     Tenderness: There is no abdominal tenderness. There is no guarding.  Musculoskeletal:     Cervical back: Normal range of motion.  Skin:    General: Skin is warm and dry.  Neurological:     Mental Status: She is alert and oriented to person, place, and time.  Psychiatric:        Behavior: Behavior normal.     ED Results / Procedures / Treatments   Labs (all labs ordered are listed, but only abnormal results are displayed) Labs Reviewed - No data to display  EKG None  Radiology No results found.  Procedures Procedures    Medications Ordered in ED Medications - No data to display  ED Course/ Medical Decision Making/ A&P                           Medical Decision Making Patient here with suspected subareolar breast abscess and painful lump.  She is given urgent referral to the [facility] Center of [facility] imaging.  She has a primary care physician with which to follow-up.  Patient will be discharged with doxycycline and Norco.  She is afebrile and hemodynamically stable and appropriate for discharge at this time .pdmp           Final Clinical Impression(s) / ED Diagnoses Final diagnoses:  None    Rx / DC Orders ED Discharge Orders     None         Margarita Mail, PA-C 02/09/22 2105    Godfrey Pick, MD 02/09/22 2119

## 2022-02-11 ENCOUNTER — Other Ambulatory Visit: Payer: Self-pay | Admitting: Family Medicine

## 2022-02-11 DIAGNOSIS — Z803 Family history of malignant neoplasm of breast: Secondary | ICD-10-CM | POA: Diagnosis not present

## 2022-02-11 DIAGNOSIS — Z1239 Encounter for other screening for malignant neoplasm of breast: Secondary | ICD-10-CM | POA: Diagnosis not present

## 2022-02-11 DIAGNOSIS — N644 Mastodynia: Secondary | ICD-10-CM | POA: Diagnosis not present

## 2022-02-12 ENCOUNTER — Other Ambulatory Visit: Payer: Self-pay | Admitting: Family Medicine

## 2022-02-12 ENCOUNTER — Ambulatory Visit
Admission: RE | Admit: 2022-02-12 | Discharge: 2022-02-12 | Disposition: A | Discharge status: Home / Self Care | Payer: BC Managed Care – PPO | Source: Ambulatory Visit | Attending: Family Medicine | Admitting: Family Medicine

## 2022-02-12 DIAGNOSIS — N644 Mastodynia: Secondary | ICD-10-CM

## 2022-03-05 DIAGNOSIS — L089 Local infection of the skin and subcutaneous tissue, unspecified: Secondary | ICD-10-CM | POA: Diagnosis not present

## 2022-04-16 DIAGNOSIS — Z0000 Encounter for general adult medical examination without abnormal findings: Secondary | ICD-10-CM | POA: Diagnosis not present

## 2022-04-16 DIAGNOSIS — Z130 Encounter for screening for diseases of the blood and blood-forming organs and certain disorders involving the immune mechanism: Secondary | ICD-10-CM | POA: Diagnosis not present

## 2022-04-16 DIAGNOSIS — Z13220 Encounter for screening for lipoid disorders: Secondary | ICD-10-CM | POA: Diagnosis not present

## 2022-05-14 DIAGNOSIS — R399 Unspecified symptoms and signs involving the genitourinary system: Secondary | ICD-10-CM | POA: Diagnosis not present

## 2022-05-14 DIAGNOSIS — N390 Urinary tract infection, site not specified: Secondary | ICD-10-CM | POA: Diagnosis not present

## 2022-05-14 DIAGNOSIS — G43909 Migraine, unspecified, not intractable, without status migrainosus: Secondary | ICD-10-CM | POA: Diagnosis not present

## 2022-05-14 DIAGNOSIS — R110 Nausea: Secondary | ICD-10-CM | POA: Diagnosis not present

## 2022-08-01 DIAGNOSIS — L309 Dermatitis, unspecified: Secondary | ICD-10-CM | POA: Diagnosis not present

## 2022-08-01 DIAGNOSIS — G43909 Migraine, unspecified, not intractable, without status migrainosus: Secondary | ICD-10-CM | POA: Diagnosis not present

## 2023-01-05 IMAGING — DX DG ABDOMEN ACUTE W/ 1V CHEST
3 series · 3 of 3 positions shown · non-contrast
Comparison: None.

CLINICAL DATA: Shortness of breath with nausea and indigestion.

EXAM:
DG ABDOMEN ACUTE WITH 1 VIEW CHEST

[chest pa]
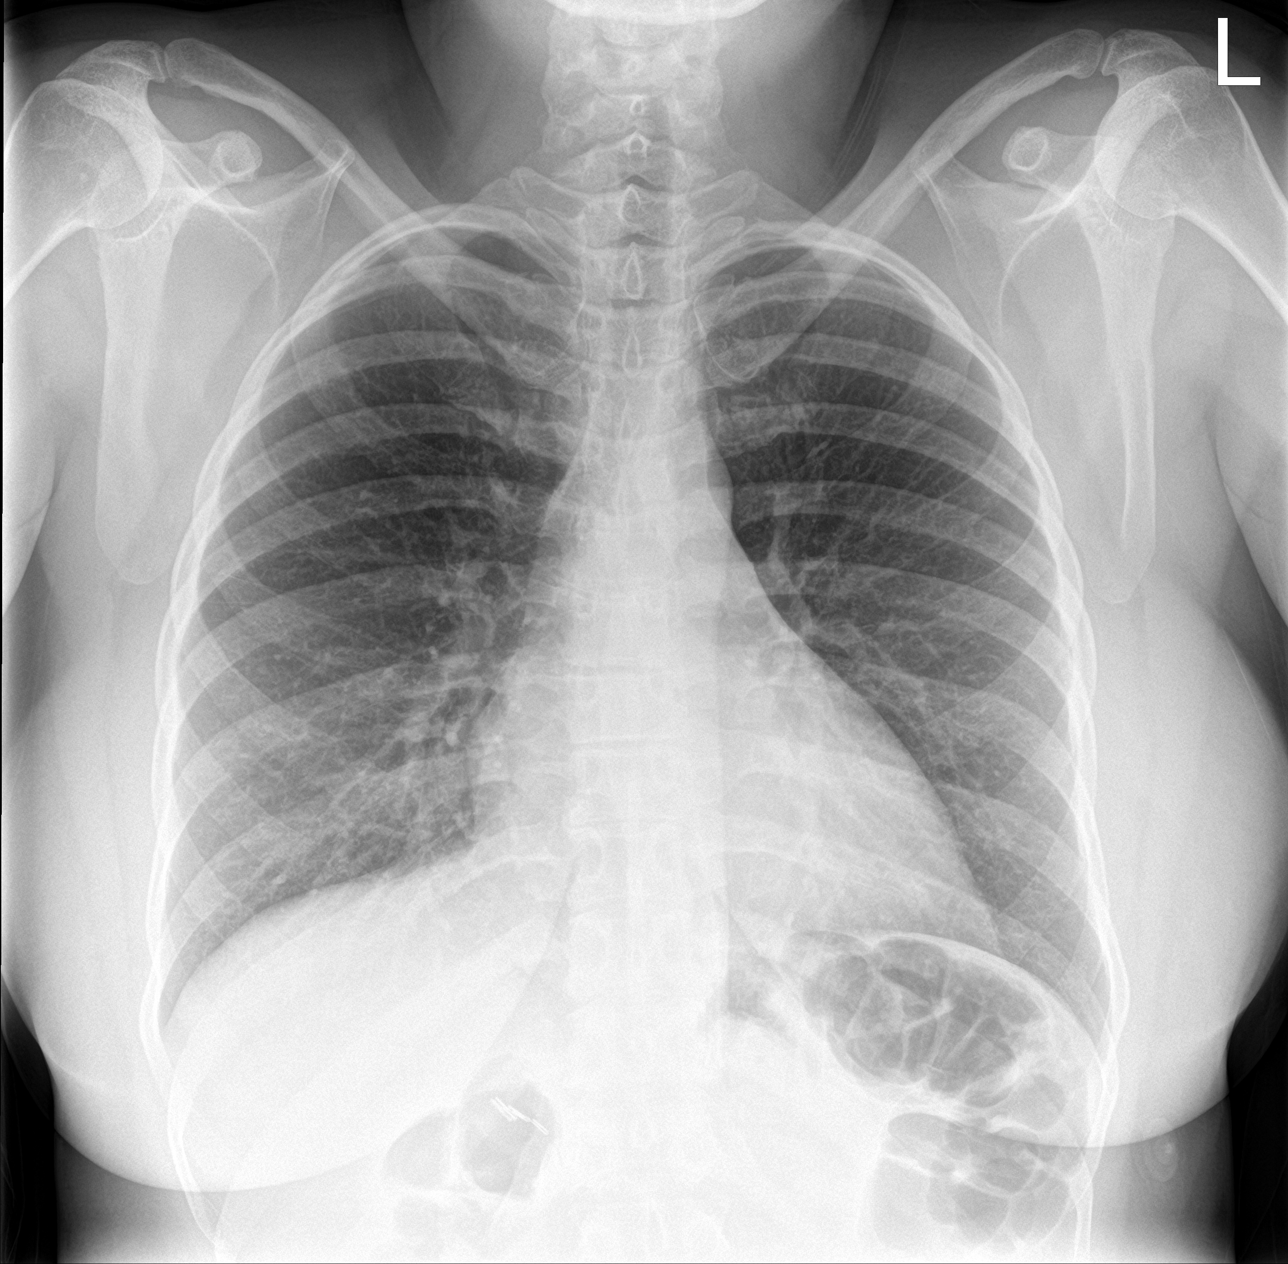

[abdomen erect]
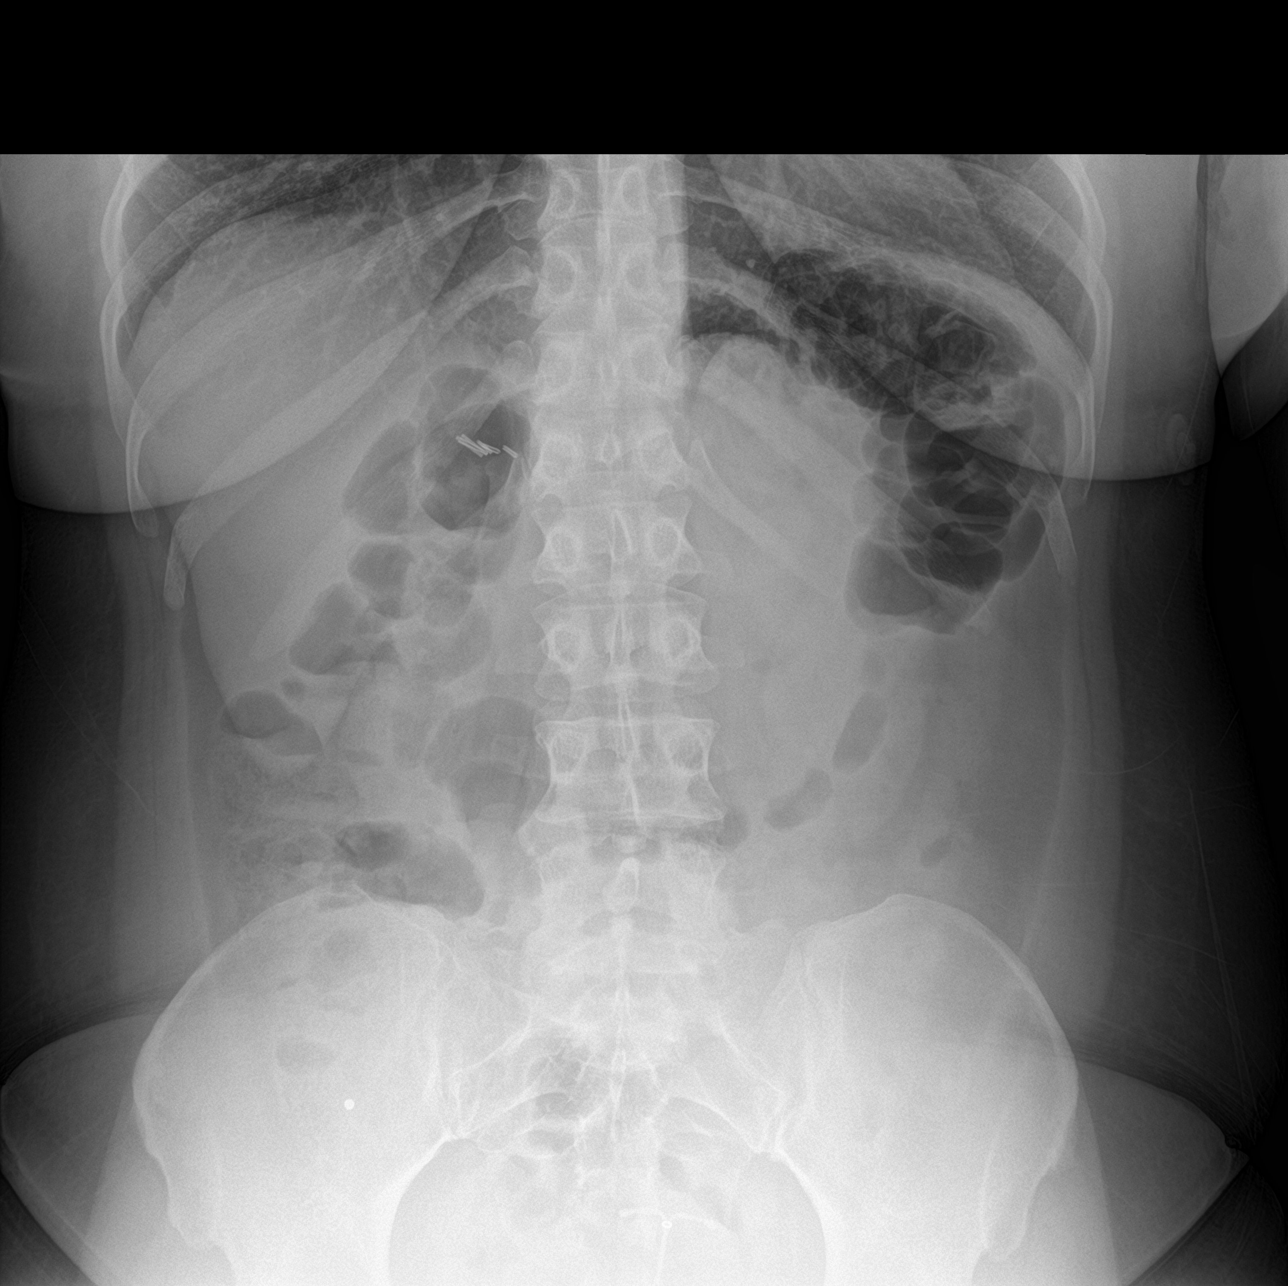

[abdomen supine]
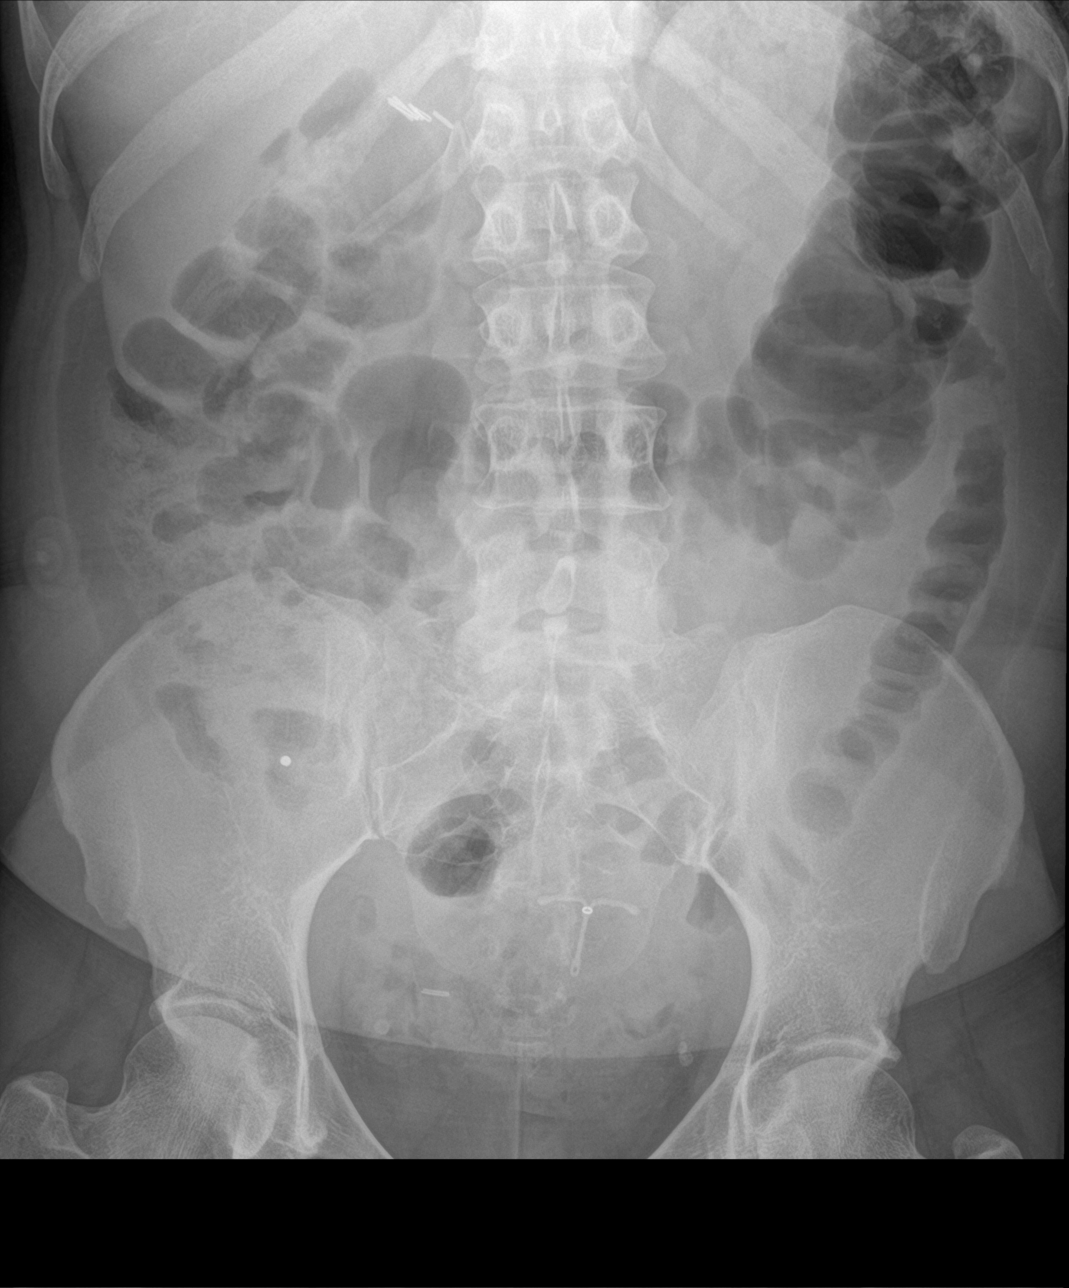

[3 of 3 positions shown; findings below may reference images not displayed]

FINDINGS: There is no evidence of dilated bowel loops or free intraperitoneal
air. No radiopaque calculi are seen. A small radiopaque BB is seen
overlying the right iliac bone. Radiopaque surgical clips are seen
overlying the right upper quadrant. An IUD is in place. Heart size
and mediastinal contours are within normal limits. Both lungs are
clear.
IMPRESSION: Negative abdominal radiographs.  No acute cardiopulmonary disease.

## 2023-01-10 ENCOUNTER — Emergency Department (HOSPITAL_COMMUNITY)
Admission: EM | Admit: 2023-01-10 | Discharge: 2023-01-10 | Disposition: A | Discharge status: Home / Self Care | Payer: BC Managed Care – PPO | Attending: Emergency Medicine | Admitting: Emergency Medicine

## 2023-01-10 ENCOUNTER — Emergency Department (HOSPITAL_COMMUNITY): Payer: BC Managed Care – PPO

## 2023-01-10 ENCOUNTER — Other Ambulatory Visit: Payer: Self-pay

## 2023-01-10 ENCOUNTER — Encounter (HOSPITAL_COMMUNITY): Payer: Self-pay

## 2023-01-10 DIAGNOSIS — Y9389 Activity, other specified: Secondary | ICD-10-CM | POA: Insufficient documentation

## 2023-01-10 DIAGNOSIS — M778 Other enthesopathies, not elsewhere classified: Secondary | ICD-10-CM

## 2023-01-10 DIAGNOSIS — Z91040 Latex allergy status: Secondary | ICD-10-CM | POA: Insufficient documentation

## 2023-01-10 DIAGNOSIS — M67834 Other specified disorders of tendon, left wrist: Secondary | ICD-10-CM | POA: Insufficient documentation

## 2023-01-10 MED ORDER — DICLOFENAC SODIUM 75 MG PO TBEC: 75.0000 mg | DELAYED_RELEASE_TABLET | Freq: Two times a day (BID) | ORAL | 0 refills | Status: DC

## 2023-01-10 NOTE — Discharge Instructions (Addendum)
Wear brace, Ice 20 minutes 4 times a day for the next 2 days

## 2023-01-10 NOTE — ED Triage Notes (Signed)
Pt reports 1 week of left wrist pain, denies trauma. Pt wearing brace on wrist

## 2023-01-10 NOTE — ED Notes (Signed)
Transported to X-Ray

## 2023-01-10 NOTE — ED Provider Notes (Signed)
[facility] Provider Note   CSN: [id number] Arrival date & time: 01/10/23  1101     History  Chief Complaint  Patient presents with   Wrist Pain    Sandra Walker is a 42 y.o. female.  Patient complains of pain in her left wrist for the past week.  Patient reports that she has not had any injury.  She reports she is right-handed.  Patient denies any repetitive use of left hand.  Patient reports she does lift a child frequently.  Patient reports just to the left ulnar area as area of increased pain.  Patient has not had any redness or swelling she denies any fever or chills.  The history is provided by the patient. No language interpreter was used.  Wrist Pain       Home Medications Prior to Admission medications   Medication Sig Start Date End Date Taking? Authorizing Provider  diclofenac (VOLTAREN) 75 MG EC tablet Take 1 tablet (75 mg total) by mouth 2 (two) times daily. 01/10/23  Yes Cheron Schaumann K, PA-C  doxycycline (VIBRAMYCIN) 100 MG capsule Take 1 capsule (100 mg total) by mouth 2 (two) times daily. One po bid x 7 days 02/09/22   Arthor Captain, PA-C  HYDROcodone-acetaminophen (NORCO) 5-325 MG tablet Take 1 tablet by mouth every 6 (six) hours as needed for moderate pain. 02/09/22   Arthor Captain, PA-C      Allergies    Penicillin g, Latex, Penicillins, and Shellfish allergy    Review of Systems   Review of Systems  Musculoskeletal:  Positive for myalgias.  All other systems reviewed and are negative.   Physical Exam Updated Vital Signs BP 126/80   Pulse (!) 101   Temp 98 F (36.7 C) (Oral)   Resp 20   Ht 5\' 8"  (1.727 m)   Wt 86.2 kg   LMP 01/10/2023   SpO2 99%   BMI 28.89 kg/m  Physical Exam Vitals and nursing note reviewed.  Constitutional:      Appearance: She is well-developed.  HENT:     Head: Normocephalic.  Cardiovascular:     Rate and Rhythm: Normal rate.  Pulmonary:     Effort: Pulmonary  effort is normal.  Abdominal:     General: There is no distension.  Musculoskeletal:        General: Tenderness present. No swelling.     Cervical back: Normal range of motion.     Comments: Tender left distal ulna area full range of motion, pain with range of motion neurovascular neurosensory intact.  Patient has pain with flexion of thumb.  Neurological:     Mental Status: She is alert and oriented to person, place, and time.     ED Results / Procedures / Treatments   Labs (all labs ordered are listed, but only abnormal results are displayed) Labs Reviewed - No data to display  EKG None  Radiology DG Wrist Complete Left  Result Date: 01/10/2023 CLINICAL DATA:  Left wrist pain.  Pain localized mostly medially. EXAM: LEFT WRIST - COMPLETE 3+ VIEW COMPARISON:  None Available. FINDINGS: There is no evidence of fracture or dislocation. There is no evidence of arthropathy or other focal bone abnormality. Soft tissues are unremarkable. IMPRESSION: Negative. Electronically Signed   By: Signa Kell M.D.   On: 01/10/2023 11:50    Procedures Procedures    Medications Ordered in ED Medications - No data to display  ED Course/ Medical Decision Making/ A&P  Medical Decision Making Patient complains of pain in her left wrist.  No injury pain with moving  Amount and/or Complexity of Data Reviewed Radiology: ordered and independent interpretation performed. Decision-making details documented in ED Course.    Details: X-ray left wrist ordered reviewed and interpreted x-ray shows no acute changes  Risk Prescription drug management. Risk Details: Patient counseled on tendinitis to left wrist.  Patient is advised ice she is advised to continue wearing splint.  Rx for diclofenac.  Patient is advised to follow-up with hand surgery for reevaluation if pain persist.           Final Clinical Impression(s) / ED Diagnoses Final diagnoses:  Left wrist  tendonitis    Rx / DC Orders ED Discharge Orders          Ordered    diclofenac (VOLTAREN) 75 MG EC tablet  2 times daily        01/10/23 1218           An After Visit Summary was printed and given to the patient.    Elson Areas, PA-C 01/10/23 1408    Linwood Dibbles, MD 01/12/23 [phone]

## 2023-03-04 ENCOUNTER — Emergency Department (HOSPITAL_COMMUNITY)
Admission: EM | Admit: 2023-03-04 | Discharge: 2023-03-04 | Discharge status: Against Medical Advice | Payer: Self-pay | Attending: Emergency Medicine | Admitting: Emergency Medicine

## 2023-03-04 ENCOUNTER — Other Ambulatory Visit: Payer: Self-pay

## 2023-03-04 DIAGNOSIS — M79642 Pain in left hand: Secondary | ICD-10-CM | POA: Insufficient documentation

## 2023-03-04 DIAGNOSIS — Z5321 Procedure and treatment not carried out due to patient leaving prior to being seen by health care provider: Secondary | ICD-10-CM | POA: Insufficient documentation

## 2023-03-04 NOTE — ED Notes (Signed)
Patient decided to leave couldn't wait no longer if she couldn't get back now

## 2023-03-04 NOTE — ED Triage Notes (Signed)
Pt. Stated, Sandra Walker had left hand pain for a month and I have an appt with a hand Dr. On the Dec, 23. I was here previously and the pain is too bad for me to wait.

## 2023-05-18 ENCOUNTER — Other Ambulatory Visit: Payer: Self-pay

## 2023-05-18 ENCOUNTER — Ambulatory Visit (HOSPITAL_COMMUNITY)
Admission: EM | Admit: 2023-05-18 | Discharge: 2023-05-18 | Disposition: A | Discharge status: Home / Self Care | Payer: Self-pay | Attending: Emergency Medicine | Admitting: Emergency Medicine

## 2023-05-18 ENCOUNTER — Encounter (HOSPITAL_COMMUNITY): Payer: Self-pay | Admitting: *Deleted

## 2023-05-18 DIAGNOSIS — K5900 Constipation, unspecified: Secondary | ICD-10-CM | POA: Insufficient documentation

## 2023-05-18 DIAGNOSIS — R1084 Generalized abdominal pain: Secondary | ICD-10-CM | POA: Insufficient documentation

## 2023-05-18 DIAGNOSIS — N898 Other specified noninflammatory disorders of vagina: Secondary | ICD-10-CM | POA: Insufficient documentation

## 2023-05-18 DIAGNOSIS — Z113 Encounter for screening for infections with a predominantly sexual mode of transmission: Secondary | ICD-10-CM | POA: Insufficient documentation

## 2023-05-18 LAB — POCT URINALYSIS DIP (MANUAL ENTRY)
Bilirubin, UA: NEGATIVE
Glucose, UA: NEGATIVE mg/dL
Ketones, POC UA: NEGATIVE mg/dL
Nitrite, UA: NEGATIVE
Protein Ur, POC: NEGATIVE mg/dL
Spec Grav, UA: <=1.005 — AB (ref 1.010–1.025)
Urobilinogen, UA: 0.2 U/dL
pH, UA: 6 (ref 5.0–8.0)

## 2023-05-18 LAB — POCT URINE PREGNANCY: Preg Test, Ur: NEGATIVE

## 2023-05-18 MED ORDER — POLYETHYLENE GLYCOL 4500 POWD: 17.0000 g | Freq: Every day | 0 refills | Status: AC

## 2023-05-18 NOTE — Discharge Instructions (Addendum)
 We will call you if anything on your swab returns positive. You can also see these results on MyChart. Please abstain from sexual intercourse until your results return.  I recommend MiraLAX (polyethylene glycol).  1 capful is 1 dose.  Dissolve 1 dose in a glass of water and drink it down tonight.  You can repeat tomorrow morning and then for the next few days

## 2023-05-18 NOTE — ED Provider Notes (Signed)
 [facility]    CSN: [id number] Arrival date & time: 05/18/23  1751      History   Chief Complaint Chief Complaint  Patient presents with   Abdominal Pain   Constipation    HPI Sandra Walker is a 43 y.o. female.  Several days of abdominal discomfort Had a normal BM 1 week ago. Since then a little bit of liquid stool each time. Last was today. Some nausea but no vomiting. No fevers. Used dulcolax last night  Also some vaginal discharge and odor Unprotected intercourse, unknown if exposure Not having urinary symptoms  LMP started yesterday.  She just had her IUD removed a few months ago  History reviewed. No pertinent past medical history.  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      Home Medications    Prior to Admission medications   Medication Sig Start Date End Date Taking? Authorizing Provider  Polyethylene Glycol 4500 POWD 17 g by Does not apply route daily at 6 (six) AM. Dissolve 1 capful of powder in one 8 ounce glass of water. Repeat daily or as needed to soften stool 05/18/23  Yes Lio Wehrly, Lurena Joiner, PA-C    Family History Family History  Problem Relation Age of Onset   Healthy Mother    Heart Problems Father    Diabetes Father    Breast cancer Maternal Grandmother 76    Social History Social History   Tobacco Use   Smoking status: Never   Smokeless tobacco: Never  Substance Use Topics   Alcohol use: Yes   Drug use: Not Currently     Allergies   Penicillin g, Latex, Penicillins, and Shellfish allergy   Review of Systems Review of Systems Per HPI  Physical Exam Triage Vital Signs ED Triage Vitals  Encounter Vitals Group     BP 05/18/23 1938 (!) 153/101     Systolic BP Percentile --      Diastolic BP Percentile --      Pulse Rate 05/18/23 1938 92      Resp 05/18/23 1938 20     Temp 05/18/23 1938 98 F (36.7 C)     Temp src --      SpO2 05/18/23 1938 99 %     Weight --      Height --      Head Circumference --      Peak Flow --      Pain Score 05/18/23 1936 9     Pain Loc --      Pain Education --      Exclude from Growth Chart --    No data found.  Updated Vital Signs BP (!) 153/101   Pulse 92   Temp 98 F (36.7 C)   Resp 20   LMP 05/18/2023   SpO2 99%   Visual Acuity Right Eye Distance:   Left Eye Distance:   Bilateral Distance:    Right Eye Near:   Left Eye Near:    Bilateral Near:     Physical Exam Vitals and nursing note reviewed.  Constitutional:      Appearance: Normal appearance.  HENT:     Mouth/Throat:     Mouth: Mucous membranes are moist.     Pharynx: Oropharynx is clear.  Eyes:  Conjunctiva/sclera: Conjunctivae normal.  Cardiovascular:     Rate and Rhythm: Normal rate and regular rhythm.     Heart sounds: Normal heart sounds.  Pulmonary:     Effort: Pulmonary effort is normal. No respiratory distress.     Breath sounds: Normal breath sounds.  Abdominal:     General: Bowel sounds are normal.     Palpations: Abdomen is soft.     Tenderness: There is no abdominal tenderness. There is no right CVA tenderness, left CVA tenderness, guarding or rebound.  Musculoskeletal:        General: Normal range of motion.  Skin:    General: Skin is warm and dry.  Neurological:     Mental Status: She is alert and oriented to person, place, and time.      UC Treatments / Results  Labs (all labs ordered are listed, but only abnormal results are displayed) Labs Reviewed  POCT URINALYSIS DIP (MANUAL ENTRY) - Abnormal; Notable for the following components:      Result Value   Spec Grav, UA <=1.005 (*)    Blood, UA moderate (*)    Leukocytes, UA Large (3+) (*)    All other components within normal limits  POCT URINE PREGNANCY    EKG   Radiology No results found.  Procedures Procedures  (including critical care time)  Medications Ordered in UC Medications - No data to display  Initial Impression / Assessment and Plan / UC Course  I have reviewed the triage vital signs and the nursing notes.  Pertinent labs & imaging results that were available during my care of the patient were reviewed by me and considered in my medical decision making (see chart for details).  Afebrile and well-appearing UPT negative Consider constipation as cause of abdominal pain, she may have a form of encopresis.  I do recommend trial miralax and monitor symptoms.  Menstrual cramping likely contributing as well. We are also testing for STDs, BV and yeast.  Cytology swab is pending.  Will treat positive result as indicated.  Advise return precautions if symptoms are persisting, and ED precautions for any worsening or severe.  Patient is agreeable to plan, all questions are answered  She will also follow with her ob/gyn as she voices wanting to be back on birth control  Final Clinical Impressions(s) / UC Diagnoses   Final diagnoses:  Generalized abdominal pain  Constipation, unspecified constipation type  Screen for STD (sexually transmitted disease)  Vaginal odor     Discharge Instructions      We will call you if anything on your swab returns positive. You can also see these results on MyChart. Please abstain from sexual intercourse until your results return.  I recommend MiraLAX (polyethylene glycol).  1 capful is 1 dose.  Dissolve 1 dose in a glass of water and drink it down tonight.  You can repeat tomorrow morning and then for the next few days     ED Prescriptions     Medication Sig Dispense Auth. Provider   Polyethylene Glycol 4500 POWD 17 g by Does not apply route daily at 6 (six) AM. Dissolve 1 capful of powder in one 8 ounce glass of water. Repeat daily or as needed to soften stool 500 g Daveah Varone, Lurena Joiner, PA-C      PDMP not reviewed this encounter.   Maigan Bittinger, Ray Church 05/18/23 2011

## 2023-05-18 NOTE — ED Triage Notes (Signed)
 Pt took laxative yesterday for possible constipation. Pt now has ABD cramping . Pt also started her period.

## 2023-05-19 ENCOUNTER — Telehealth: Payer: Self-pay | Admitting: Physician Assistant

## 2023-05-19 DIAGNOSIS — R3989 Other symptoms and signs involving the genitourinary system: Secondary | ICD-10-CM

## 2023-05-19 LAB — CERVICOVAGINAL ANCILLARY ONLY
Bacterial Vaginitis (gardnerella): POSITIVE — AB
Candida Glabrata: NEGATIVE
Candida Vaginitis: NEGATIVE
Chlamydia: NEGATIVE
Comment: NEGATIVE
Comment: NEGATIVE
Comment: NEGATIVE
Comment: NEGATIVE
Comment: NEGATIVE
Comment: NORMAL
Neisseria Gonorrhea: NEGATIVE
Trichomonas: POSITIVE — AB

## 2023-05-19 MED ORDER — NITROFURANTOIN MONOHYD MACRO 100 MG PO CAPS
100.0000 mg | ORAL_CAPSULE | Freq: Two times a day (BID) | ORAL | 0 refills | Status: DC
Start: 2023-05-19 — End: 2023-05-20

## 2023-05-19 NOTE — Progress Notes (Signed)
 Virtual Visit Consent   Sandra Walker, you are scheduled for a virtual visit with a [facility] provider today. Just as with appointments in the office, your consent must be obtained to participate. Your consent will be active for this visit and any virtual visit you may have with one of our providers in the next 365 days. If you have a MyChart account, a copy of this consent can be sent to you electronically.  As this is a virtual visit, video technology does not allow for your provider to perform a traditional examination. This may limit your provider's ability to fully assess your condition. If your provider identifies any concerns that need to be evaluated in person or the need to arrange testing (such as labs, EKG, etc.), we will make arrangements to do so. Although advances in technology are sophisticated, we cannot ensure that it will always work on either your end or our end. If the connection with a video visit is poor, the visit may have to be switched to a telephone visit. With either a video or telephone visit, we are not always able to ensure that we have a secure connection.  By engaging in this virtual visit, you consent to the provision of healthcare and authorize for your insurance to be billed (if applicable) for the services provided during this visit. Depending on your insurance coverage, you may receive a charge related to this service.  I need to obtain your verbal consent now. Are you willing to proceed with your visit today? Michaila Kenney Dalsanto has provided verbal consent on 05/19/2023 for a virtual visit (video or telephone). Margaretann Loveless, PA-C  Date: 05/19/2023 4:20 PM   Virtual Visit via Video Note   I, Margaretann Loveless, connected with  Sandra Walker  ([id number], 29-May-1980) on 05/19/23 at  4:15 PM EST by a video-enabled telemedicine application and verified that I am speaking with the correct person using two identifiers.  Location: Patient:  Virtual Visit Location Patient: Home Provider: Virtual Visit Location Provider: Home Office   I discussed the limitations of evaluation and management by telemedicine and the availability of in person appointments. The patient expressed understanding and agreed to proceed.    History of Present Illness: Sandra Walker is a 43 y.o. who identifies as a female who was assigned female at birth, and is being seen today for abdominal pain and to discuss lab results. Was [facility] at [facility] yesterday, 05/18/23. Had UA that did show elevated leukocytes. Did also have moderate blood but she is currently on menstrual cycle. She does report having a strong vaginal odor, and does have history of recent unprotected sex with her partner (this was the first time of unprotected sex with this same partner). Her cervicovaginal swab is still pending.    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 Dissolve 1 capful of powder in one 8 ounce glass of water. Repeat daily or as needed to soften stool, Disp: 500 g, Rfl: 0  Observations/Objective: Patient is well-developed, well-nourished in no acute distress.  Resting comfortably at home.  Head is normocephalic, atraumatic.  No labored breathing.  Speech is clear and coherent with logical content.  Patient is alert and oriented at baseline.    Assessment and Plan: 1. Suspected UTI (Primary) - nitrofurantoin, macrocrystal-monohydrate, (MACROBID) 100 MG capsule; Take 1 capsule (100 mg total) by mouth 2  (two) times daily.  Dispense: 10 capsule; Refill: 0  - Stable symptoms compared to yesterday with high leukocytes on UA and having suprapubic pressure - Will treat empirically with Macrobid  - May use AZO for bladder spasms - Continue to push fluids.  - Seek in person evaluation for urine culture if symptoms do not improve or if they worsen.    Follow Up Instructions: I discussed the assessment and treatment plan with the patient. The patient was provided an opportunity to ask questions and all were answered. The patient agreed with the plan and demonstrated an understanding of the instructions.  A copy of instructions were sent to the patient via MyChart unless otherwise noted below.    The patient was advised to call back or seek an in-person evaluation if the symptoms worsen or if the condition fails to improve as anticipated.    Margaretann Loveless, PA-C

## 2023-05-19 NOTE — Patient Instructions (Signed)
 Sandra Walker, thank you for joining Margaretann Loveless, PA-C for today's virtual visit.  While this provider is not your primary care provider (PCP), if your PCP is located in our provider database this encounter information will be shared with them immediately following your visit.   A [facility] MyChart account gives you access to today's visit and all your visits, tests, and labs performed at [facility] " click here if you don't have a [facility] MyChart account or go to [email or web address]  Consent: (Patient) Shawntae Lowy Bartha provided verbal consent for this virtual visit at the beginning of the encounter.  Current Medications:  Current Outpatient Medications:    nitrofurantoin, macrocrystal-monohydrate, (MACROBID) 100 MG capsule, Take 1 capsule (100 mg total) by mouth 2 (two) times daily., Disp: 10 capsule, Rfl: 0   Polyethylene Glycol 4500 POWD, 17 g by Does not apply route daily at 6 (six) AM. Dissolve 1 capful of powder in one 8 ounce glass of water. Repeat daily or as needed to soften stool, Disp: 500 g, Rfl: 0   Medications ordered in this encounter:  Meds ordered this encounter  Medications   nitrofurantoin, macrocrystal-monohydrate, (MACROBID) 100 MG capsule    Sig: Take 1 capsule (100 mg total) by mouth 2 (two) times daily.    Dispense:  10 capsule    Refill:  0    Supervising Provider:   Merrilee Jansky [[id number]]     *If you need refills on other medications prior to your next appointment, please contact your pharmacy*  Follow-Up: Call back or seek an in-person evaluation if the symptoms worsen or if the condition fails to improve as anticipated.   Virtual Care [phone]  Other Instructions Urinary Tract Infection, Female A urinary tract infection (UTI) is an infection in your urinary tract. The urinary tract is made up of organs that make, store, and get rid of pee (urine) in your body. These organs include: The  kidneys. The ureters. The bladder. The urethra. What are the causes? Most UTIs are caused by germs called bacteria. They may be in or near your genitals. These germs grow and cause swelling in your urinary tract. What increases the risk? You're more likely to get a UTI if: You're a female. The urethra is shorter in females than in males. You have a soft tube called a catheter that drains your pee. You can't control when you pee or poop. You have trouble peeing because of: A kidney stone. A urinary blockage. A nerve condition that affects your bladder. Not getting enough to drink. You're sexually active. You use a birth control inside your vagina, like spermicide. You're pregnant. You have low levels of the hormone estrogen in your body. You're an older adult. You're also more likely to get a UTI if you have other health problems. These may include: Diabetes. A weak immune system. Your immune system is your body's defense system. Sickle cell disease. Injury of the spine. What are the signs or symptoms? Symptoms may include: Needing to pee right away. Peeing small amounts often. Pain or burning when you pee. Blood in your pee. Pee that smells bad or odd. Pain in your belly or lower back. You may also: Feel confused. This may be the first symptom in older adults. Vomit. Not feel hungry. Feel tired or easily annoyed. Have a fever or chills. How is this diagnosed? A UTI is diagnosed based on your medical history and an exam. You may also have other tests.  These may include: Pee tests. Blood tests. Tests for sexually transmitted infections (STIs). If you've had more than one UTI, you may need to have imaging studies done to find out why you keep getting them. How is this treated? A UTI can be treated by: Taking antibiotics or other medicines. Drinking enough fluid to keep your pee pale yellow. In rare cases, a UTI can cause a very bad condition called sepsis. Sepsis may be  treated in the hospital. Follow these instructions at home: Medicines Take your medicines only as told by your health care provider. If you were given antibiotics, take them as told by your provider. Do not stop taking them even if you start to feel better. General instructions Make sure you: Pee often and fully. Do not hold your pee for a long time. Wipe from front to back after you pee or poop. Use each tissue only once when you wipe. Pee after you have sex. Do not douche or use sprays or powders in your genital area. Contact a health care provider if: Your symptoms don't get better after 1-2 days of taking antibiotics. Your symptoms go away and then come back. You have a fever or chills. You vomit or feel like you may vomit. Get help right away if: You have very bad pain in your back or lower belly. You faint. This information is not intended to replace advice given to you by your health care provider. Make sure you discuss any questions you have with your health care provider. Document Revised: 10/23/2022 Document Reviewed: 06/20/2022 Elsevier Patient Education  2024 Elsevier Inc.   If you have been instructed to have an in-person evaluation today at a local Urgent Care facility, please use the link below. It will take you to a list of all of our available [facility], including address, phone number and hours of operation. Please do not delay care.  [facility]  If you or a family member do not have a primary care provider, use the link below to schedule a visit and establish care. When you choose a [facility] primary care physician or advanced practice provider, you gain a long-term partner in health. Find a Primary Care Provider  Learn more about [facility] in-office and virtual care options: [facility] - Get Care Now

## 2023-05-20 ENCOUNTER — Telehealth: Payer: Self-pay | Admitting: Physician Assistant

## 2023-05-20 DIAGNOSIS — A599 Trichomoniasis, unspecified: Secondary | ICD-10-CM

## 2023-05-20 DIAGNOSIS — N760 Acute vaginitis: Secondary | ICD-10-CM

## 2023-05-20 DIAGNOSIS — B9689 Other specified bacterial agents as the cause of diseases classified elsewhere: Secondary | ICD-10-CM

## 2023-05-20 DIAGNOSIS — K5900 Constipation, unspecified: Secondary | ICD-10-CM

## 2023-05-20 MED ORDER — METRONIDAZOLE 500 MG PO TABS
500.0000 mg | ORAL_TABLET | Freq: Two times a day (BID) | ORAL | 0 refills | Status: DC
Start: 2023-05-20 — End: 2023-05-30

## 2023-05-20 NOTE — Patient Instructions (Signed)
 Sandra Walker, thank you for joining [name], PA-C for today's virtual visit.  While this provider is not your primary care provider (PCP), if your PCP is located in our provider database this encounter information will be shared with them immediately following your visit.   A [facility] MyChart account gives you access to today's visit and all your visits, tests, and labs performed at [facility] " click here if you don't have a [facility] MyChart account or go to [email or web address]  Consent: (Patient) Sandra Walker provided verbal consent for this virtual visit at the beginning of the encounter.  Current Medications:  Current Outpatient Medications:    nitrofurantoin, macrocrystal-monohydrate, (MACROBID) 100 MG capsule, Take 1 capsule (100 mg total) by mouth 2 (two) times daily., Disp: 10 capsule, Rfl: 0   Polyethylene Glycol 4500 POWD, 17 g by Does not apply route daily at 6 (six) AM. Dissolve 1 capful of powder in one 8 ounce glass of water. Repeat daily or as needed to soften stool, Disp: 500 g, Rfl: 0   Medications ordered in this encounter:  No orders of the defined types were placed in this encounter.    *If you need refills on other medications prior to your next appointment, please contact your pharmacy*  Follow-Up: Call back or seek an in-person evaluation if the symptoms worsen or if the condition fails to improve as anticipated.  [facility] [phone]  Other Instructions I encourage you to increase hydration and the amount of fiber in your diet.  Start a daily probiotic (Align, Culturelle, Digestive Advantage, etc.). If no bowel movement within 24 hours, take 2 Tbs of Milk of Magnesia in a 4 oz glass of warmed prune juice every 2-3 days to help promote bowel movement. If no results within 24 hours, then repeat above regimen, adding a Dulcolax stool softener to regimen. If this does not promote a bowel movement,  please call the office.   Trichomoniasis Trichomoniasis is a sexually transmitted infection (STI). Many people with trichomoniasis do not have any symptoms (are asymptomatic) or have only minimal symptoms. Untreated trichomoniasis can last from months to years. This condition is treated with medicine. What are the causes? This condition is caused by a parasite called Trichomonas vaginalis and is transmitted during sexual contact. What increases the risk? The following factors may make you more likely to develop this condition: Having unprotected sex. Having sex with a partner who has trichomoniasis. Having multiple sexual partners. Having had previous trichomoniasis infections or other STIs. What are the signs or symptoms? In females, symptoms of trichomoniasis include: Itching, burning, redness, or soreness in the genital area. Discomfort while urinating. Abnormal vaginal discharge that is clear, white, gray, or yellow-green and foamy and has an unusual fishy odor. In males, symptoms of trichomoniasis include: Discharge from the penis. Burning after urination or ejaculation. Itching or discomfort inside the penis. How is this diagnosed? This condition is diagnosed based on tests. To perform a test, your health care provider will do one of the following: Ask you to provide a urine sample. Take a sample of discharge. The sample may be taken from the vagina or cervix in females and from the urethra in males. Your health care provider may use a swab to collect the sample. Your health care provider may test you for other STIs, including human immunodeficiency virus (HIV). How is this treated?  This condition is treated with medicines such as metronidazole or tinidazole. These are called  antimicrobial medicines, and they are taken by mouth (orally). Your sexual partner or partners also need to be tested and treated. If they have the infection and are not treated, you will likely get  reinfected. If you plan to become pregnant or think you may be pregnant, tell your health care provider right away. Some medicines that are used to treat the infection should not be taken during pregnancy. Your health care provider may recommend over-the-counter medicines or creams to help relieve itching or irritation. You may be tested for the infection again 3 months after treatment. Follow these instructions at home: Medicines Take over-the-counter and prescription medicines only as told by your health care provider. Take your antimicrobial medicine as told by your health care provider. Do not stop taking it even if you start to feel better. Use creams as told by your health care provider. General instructions Do not have sex until after you finish your medicine and your symptoms have resolved. Do not wear tampons while you have the infection (if you are female). Talk with your sexual partner or partners about any symptoms that either of you may have, as well as any history of STIs. Keep all follow-up visits. This is important. How is this prevented?  Use condoms every time you have sex. Using condoms correctly and consistently can help protect against STIs. Do not have sexual contact if you have symptoms of trichomoniasis or another STI. Avoid having multiple sexual partners. Get tested for STIs before you have sex with a partner. Ask all partners to do the same. Do not douche (if you are female). Douching may increase your risk for getting STIs due to the removal of good bacteria in the vagina. Contact a health care provider if: You still have symptoms after you finish your medicine. You develop a rash. You plan to become pregnant or think you may be pregnant. Summary Trichomoniasis is a sexually transmitted infection (STI). This condition often has no symptoms or minimal symptoms. Take your antimicrobial medicine as told by your health care provider. Do not stop taking even if you  start to feel better. Discuss your infection with your sexual partner or partners. Make sure that all partners get tested and treated, if necessary. You should not have sex until after you finish your medicine and your symptoms have resolved. Keep all follow-up visits. This is important. This information is not intended to replace advice given to you by your health care provider. Make sure you discuss any questions you have with your health care provider. Document Revised: 02/13/2021 Document Reviewed: 02/13/2021 Elsevier Patient Education  2024 Elsevier Inc.   If you have been instructed to have an in-person evaluation today at a local Urgent Care facility, please use the link below. It will take you to a list of all of our available [facility], including address, phone number and hours of operation. Please do not delay care.  [facility]  If you or a family member do not have a primary care provider, use the link below to schedule a visit and establish care. When you choose a [facility] primary care physician or advanced practice provider, you gain a long-term partner in health. Find a Primary Care Provider  Learn more about [facility] in-office and virtual care options: [facility] - Get Care Now

## 2023-05-20 NOTE — Progress Notes (Signed)
 Virtual Visit Consent   Sandra Walker, you are scheduled for a virtual visit with a  provider today. Just as with appointments in the office, your consent must be obtained to participate. Your consent will be active for this visit and any virtual visit you may have with one of our providers in the next 365 days. If you have a MyChart account, a copy of this consent can be sent to you electronically.  As this is a virtual visit, video technology does not allow for your provider to perform a traditional examination. This may limit your provider's ability to fully assess your condition. If your provider identifies any concerns that need to be evaluated in person or the need to arrange testing (such as labs, EKG, etc.), we will make arrangements to do so. Although advances in technology are sophisticated, we cannot ensure that it will always work on either your end or our end. If the connection with a video visit is poor, the visit may have to be switched to a telephone visit. With either a video or telephone visit, we are not always able to ensure that we have a secure connection.  By engaging in this virtual visit, you consent to the provision of healthcare and authorize for your insurance to be billed (if applicable) for the services provided during this visit. Depending on your insurance coverage, you may receive a charge related to this service.  I need to obtain your verbal consent now. Are you willing to proceed with your visit today? [name] has provided verbal consent on 05/20/2023 for a virtual visit (video or telephone). Sandra Walker, [place]  Date: 05/20/2023 9:07 AM   Virtual Visit via Video Note   I, Sandra Walker, connected with  [name]  ([id number], [birth year]) on 05/20/23 at  8:45 AM EST by a video-enabled telemedicine application and verified that I am speaking with the correct person using two identifiers.  Location: Patient:  Virtual Visit Location Patient: Home Provider: Virtual Visit Location Provider: Home Office   I discussed the limitations of evaluation and management by telemedicine and the availability of in person appointments. The patient expressed understanding and agreed to proceed.    History of Present Illness: Sandra Walker is a 43 y.o. who identifies as a female who was assigned female at birth, and is being seen today for results for recent UC visit. Was seen at [facility] on 2/17 for complaints of constipation and also vaginal discharge with odor. Testing performed at that time with negative pregnancy test and UA positive for LE but no urinary symptoms. Patient notes results of vaginal testing came in over night and she has been unable to get in contact with the urgent care for treatment.  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 Dissolve 1 capful of powder in one 8 ounce glass of water. Repeat daily or as needed to soften stool, Disp: 500 g, Rfl: 0  Observations/Objective: Patient is well-developed,  well-nourished in no acute distress.  Resting comfortably at home.  Head is normocephalic, atraumatic.  No labored breathing. Speech is clear and coherent with logical content.  Patient is alert and oriented at baseline.   Assessment and Plan: 1. Trichomoniasis (Primary) - metroNIDAZOLE (FLAGYL) 500 MG tablet; Take 1 tablet (500 mg total) by mouth 2 (two) times daily.  Dispense: 14 tablet; Refill: 0  2. BV (bacterial vaginosis) - metroNIDAZOLE (FLAGYL)  500 MG tablet; Take 1 tablet (500 mg total) by mouth 2 (two) times daily.  Dispense: 14 tablet; Refill: 0  Positive for BV and Trichomoniasis. Known source who she is going to contact. Will start treatment for both with Flagyl. She is to refrain from any activity until treatment is completed, symptoms resolved and she has follow-up exam and testing. Recommend full STI panel at follow-up.   3. Constipation, unspecified constipation type  Bowel regimen given to follow-up to help promote healthy BM. Follow-up with PCP for any ongoing issue.  Follow Up Instructions: I discussed the assessment and treatment plan with the patient. The patient was provided an opportunity to ask questions and all were answered. The patient agreed with the plan and demonstrated an understanding of the instructions.  A copy of instructions were sent to the patient via MyChart unless otherwise noted below.   The patient was advised to call back or seek an in-person evaluation if the symptoms worsen or if the condition fails to improve as anticipated.    Sandra Climes, PA-C

## 2023-05-30 ENCOUNTER — Telehealth: Payer: Self-pay | Admitting: Family Medicine

## 2023-05-30 DIAGNOSIS — A599 Trichomoniasis, unspecified: Secondary | ICD-10-CM

## 2023-05-30 DIAGNOSIS — B9689 Other specified bacterial agents as the cause of diseases classified elsewhere: Secondary | ICD-10-CM

## 2023-05-30 DIAGNOSIS — N760 Acute vaginitis: Secondary | ICD-10-CM

## 2023-05-30 MED ORDER — METRONIDAZOLE 500 MG PO TABS: 500.0000 mg | ORAL_TABLET | Freq: Two times a day (BID) | ORAL | 0 refills | Status: DC

## 2023-05-30 NOTE — Patient Instructions (Addendum)
 Trichomoniasis Trichomoniasis is a sexually transmitted infection (STI). Many people with trichomoniasis do not have any symptoms (are asymptomatic) or have only minimal symptoms. Untreated trichomoniasis can last from months to years. This condition is treated with medicine. What are the causes? This condition is caused by a parasite called Trichomonas vaginalis and is transmitted during sexual contact. What increases the risk? The following factors may make you more likely to develop this condition: Having unprotected sex. Having sex with a partner who has trichomoniasis. Having multiple sexual partners. Having had previous trichomoniasis infections or other STIs. What are the signs or symptoms? In females, symptoms of trichomoniasis include: Itching, burning, redness, or soreness in the genital area. Discomfort while urinating. Abnormal vaginal discharge that is clear, white, gray, or yellow-green and foamy and has an unusual fishy odor. In males, symptoms of trichomoniasis include: Discharge from the penis. Burning after urination or ejaculation. Itching or discomfort inside the penis. How is this diagnosed? This condition is diagnosed based on tests. To perform a test, your health care provider will do one of the following: Ask you to provide a urine sample. Take a sample of discharge. The sample may be taken from the vagina or cervix in females and from the urethra in males. Your health care provider may use a swab to collect the sample. Your health care provider may test you for other STIs, including human immunodeficiency virus (HIV). How is this treated?  This condition is treated with medicines such as metronidazole or tinidazole. These are called antimicrobial medicines, and they are taken by mouth (orally). Your sexual partner or partners also need to be tested and treated. If they have the infection and are not treated, you will likely get reinfected. If you plan to become  pregnant or think you may be pregnant, tell your health care provider right away. Some medicines that are used to treat the infection should not be taken during pregnancy. Your health care provider may recommend over-the-counter medicines or creams to help relieve itching or irritation. You may be tested for the infection again 3 months after treatment. Follow these instructions at home: Medicines Take over-the-counter and prescription medicines only as told by your health care provider. Take your antimicrobial medicine as told by your health care provider. Do not stop taking it even if you start to feel better. Use creams as told by your health care provider. General instructions Do not have sex until after you finish your medicine and your symptoms have resolved. Do not wear tampons while you have the infection (if you are female). Talk with your sexual partner or partners about any symptoms that either of you may have, as well as any history of STIs. Keep all follow-up visits. This is important. How is this prevented?  Use condoms every time you have sex. Using condoms correctly and consistently can help protect against STIs. Do not have sexual contact if you have symptoms of trichomoniasis or another STI. Avoid having multiple sexual partners. Get tested for STIs before you have sex with a partner. Ask all partners to do the same. Do not douche (if you are female). Douching may increase your risk for getting STIs due to the removal of good bacteria in the vagina. Contact a health care provider if: You still have symptoms after you finish your medicine. You develop a rash. You plan to become pregnant or think you may be pregnant. Summary Trichomoniasis is a sexually transmitted infection (STI). This condition often has no symptoms or minimal  symptoms. Take your antimicrobial medicine as told by your health care provider. Do not stop taking even if you start to feel better. Discuss your  infection with your sexual partner or partners. Make sure that all partners get tested and treated, if necessary. You should not have sex until after you finish your medicine and your symptoms have resolved. Keep all follow-up visits. This is important. This information is not intended to replace advice given to you by your health care provider. Make sure you discuss any questions you have with your health care provider. Document Revised: 02/13/2021 Document Reviewed: 02/13/2021 Elsevier Patient Education  2024 Elsevier Inc.Vaginal Infection (Bacterial Vaginosis): What to Know  Bacterial vaginosis is an infection of the vagina. It happens when the balance of normal germs (bacteria) in the vagina changes. It's common among females ages 38 to 20. If left untreated, it can increase your risk of getting a sexually transmitted infection (STI). If you're pregnant, you need to get treated right away. This infection can cause a baby to be born early or at a low birth weight. What are the causes? This happens when too many harmful germs grow in the vagina. The exact reason why this happens isn't known. You can't get this infection from toilet seats, bedding, swimming pools, or contact with objects around you. What increases the risk? Having new or multiple sexual partners, or unprotected sex. Douching. Using an intrauterine device (IUD). Smoking. Alcohol and drug abuse. Taking certain antibiotics. Being pregnant. You can get a vaginal infection without being sexually active. However, it most often occurs in sexually active females. What are the signs or symptoms? Some females have no symptoms. If you have symptoms, they may include: Wallace Cullens or white vaginal discharge. It can be watery or foamy. A fish-like smell, especially after sex or during your menstrual period. Itching in and around the vagina. Burning or pain with peeing. How is this diagnosed? This infection is diagnosed based on: Your  medical history. A physical exam of the vagina. Checking a sample of vaginal fluid for harmful bacteria or uncommon cells. How is this treated? This condition is treated with antibiotics. These may be given as: A pill. A cream for your vagina. A medicine that you put into your vagina called a suppository. If the infection comes back, you may need more antibiotics. Follow these instructions at home: Medicines Take your medicines only as told. Take or apply your antibiotics as told. Do not stop using them even if you start to feel better. General instructions If you have a female sexual partner, tell her about the infection. She should see her health care provider. Female partners don't need treatment. Avoid sex until treatment is complete. Drink more fluids as told. Keep the area around your vagina and rectum clean. Wash the area daily with warm water. Wipe yourself from front to back after pooping. If you're breastfeeding, talk to your provider about continuing during treatment. How is this prevented? Self-care Do not douche or use vaginal deodorant sprays. Douching can upset the balance of good and harmful bacteria in the vagina, which can cause an infection to happen again. Wear cotton or cotton-lined underwear. Avoid wearing tight pants or pantyhose, especially in the summer. Safe sex Use condoms correctly and every time you have sex. Use dental dams to protect yourself during oral sex. Limit the number of sexual partners. Get tested for STIs. Your sexual partner should also get tested. Drugs and alcohol Do not smoke, vape, or use nicotine or  tobacco. Do not use drugs. Limit the amount of alcohol you drink because it can lead to risky sexual behavior. Where to find more information To learn more: Go to TonerPromos.no. Click Health Topics A-Z. Type "bacterial vaginosis" in the search box. American Sexual Health Association (ASHA): ashasexualhealth.org U.S. Department of Health and  Health and safety inspector, Office on Women's Health: TravelLesson.ca Contact a health care provider if: Your symptoms don't get better, even after treatment. You have more discharge or pain when peeing. You have a fever or chills. You have pain in your belly or pelvis. You have pain during sex. You have vaginal bleeding between menstrual periods. This information is not intended to replace advice given to you by your health care provider. Make sure you discuss any questions you have with your health care provider. Document Revised: 09/03/2022 Document Reviewed: 09/03/2022 Elsevier Patient Education  2024 [employer].

## 2023-05-30 NOTE — Progress Notes (Signed)
 Virtual Visit Consent   Sandra Walker, you are scheduled for a virtual visit with a [facility] provider today. Just as with appointments in the office, your consent must be obtained to participate. Your consent will be active for this visit and any virtual visit you may have with one of our providers in the next 365 days. If you have a MyChart account, a copy of this consent can be sent to you electronically.  As this is a virtual visit, video technology does not allow for your provider to perform a traditional examination. This may limit your provider's ability to fully assess your condition. If your provider identifies any concerns that need to be evaluated in person or the need to arrange testing (such as labs, EKG, etc.), we will make arrangements to do so. Although advances in technology are sophisticated, we cannot ensure that it will always work on either your end or our end. If the connection with a video visit is poor, the visit may have to be switched to a telephone visit. With either a video or telephone visit, we are not always able to ensure that we have a secure connection.  By engaging in this virtual visit, you consent to the provision of healthcare and authorize for your insurance to be billed (if applicable) for the services provided during this visit. Depending on your insurance coverage, you may receive a charge related to this service.  I need to obtain your verbal consent now. Are you willing to proceed with your visit today? Sandra Walker has provided verbal consent on 05/30/2023 for a virtual visit (video or telephone). Georgana Curio, FNP  Date: 05/30/2023 12:47 PM   Virtual Visit via Video Note   I, Georgana Curio, connected with  Sandra Walker  ([id number], 1981-02-11) on 05/30/23 at 12:45 PM EST by a video-enabled telemedicine application and verified that I am speaking with the correct person using two identifiers.  Location: Patient: Virtual Visit  Location Patient: Home Provider: Virtual Visit Location Provider: Home Office   I discussed the limitations of evaluation and management by telemedicine and the availability of in person appointments. The patient expressed understanding and agreed to proceed.    History of Present Illness: Sandra Walker is a 43 y.o. who identifies as a female who was assigned female at birth, and is being seen today for BV sx with discharge and odor as last month. She had unprotected sex again with partner who gave her trich last month and has sx again. She says she is very sorry and will not do that again without a condom. 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 Dissolve 1 capful of powder in one 8 ounce glass of water. Repeat daily or as needed to soften stool, Disp: 500 g, Rfl: 0  Observations/Objective: Patient is well-developed, well-nourished in no acute distress.  Resting comfortably  at home.  Head is normocephalic, atraumatic.  No labored breathing.  Speech is clear and  coherent with logical content.  Patient is alert and oriented at baseline.    Assessment and Plan: 1. Bacterial vaginitis (Primary)  2. Trichomoniasis - metroNIDAZOLE (FLAGYL) 500 MG tablet; Take 1 tablet (500 mg total) by mouth 2 (two) times daily.  Dispense: 14 tablet; Refill: 0  3. BV (bacterial vaginosis) - metroNIDAZOLE (FLAGYL) 500 MG tablet; Take 1 tablet (500 mg total) by mouth 2 (two) times daily.  Dispense: 14 tablet; Refill: 0  Follow up with pcp and GYN as  needed.trich  Follow Up Instructions: I discussed the assessment and treatment plan with the patient. The patient was provided an opportunity to ask questions and all were answered. The patient agreed with the plan and demonstrated an understanding of the instructions.  A copy of instructions were sent to the patient via MyChart unless otherwise noted below.     The patient was advised to call back or seek an in-person evaluation if the symptoms worsen or if the condition fails to improve as anticipated.    Georgana Curio, FNP

## 2023-07-01 ENCOUNTER — Ambulatory Visit (HOSPITAL_COMMUNITY)
Admission: RE | Admit: 2023-07-01 | Discharge: 2023-07-01 | Disposition: A | Discharge status: Home / Self Care | Payer: Self-pay | Source: Ambulatory Visit

## 2023-07-01 ENCOUNTER — Encounter (HOSPITAL_COMMUNITY): Payer: Self-pay

## 2023-07-01 VITALS — BP 115/78 | HR 122 | Temp 100.5°F | Resp 18

## 2023-07-01 DIAGNOSIS — B9789 Other viral agents as the cause of diseases classified elsewhere: Secondary | ICD-10-CM

## 2023-07-01 DIAGNOSIS — J029 Acute pharyngitis, unspecified: Secondary | ICD-10-CM

## 2023-07-01 DIAGNOSIS — J988 Other specified respiratory disorders: Secondary | ICD-10-CM

## 2023-07-01 DIAGNOSIS — R051 Acute cough: Secondary | ICD-10-CM

## 2023-07-01 LAB — POCT INFLUENZA A/B
Influenza A, POC: NEGATIVE
Influenza B, POC: NEGATIVE

## 2023-07-01 MED ORDER — ACETAMINOPHEN 325 MG PO TABS
ORAL_TABLET | ORAL | Status: AC
  Filled 2023-07-01: qty 2

## 2023-07-01 MED ORDER — ACETAMINOPHEN 325 MG PO TABS
650.0000 mg | ORAL_TABLET | Freq: Once | ORAL | Status: AC
  Administered 2023-07-01: 650 mg via ORAL

## 2023-07-01 MED ORDER — IBUPROFEN 800 MG PO TABS
ORAL_TABLET | ORAL | Status: AC
  Filled 2023-07-01: qty 1

## 2023-07-01 MED ORDER — PROMETHAZINE-DM 6.25-15 MG/5ML PO SYRP: 5.0000 mL | ORAL_SOLUTION | Freq: Every evening | ORAL | 0 refills | Status: AC | PRN

## 2023-07-01 MED ORDER — IBUPROFEN 800 MG PO TABS
800.0000 mg | ORAL_TABLET | Freq: Once | ORAL | Status: AC
  Administered 2023-07-01: 800 mg via ORAL

## 2023-07-01 MED ORDER — LIDOCAINE VISCOUS HCL 2 % MT SOLN: 15.0000 mL | OROMUCOSAL | 0 refills | Status: AC | PRN

## 2023-07-01 NOTE — ED Triage Notes (Signed)
 Pt c/o cough, sore throat, fever, body aches, headaches, and chills. Took tylenol last night.

## 2023-07-01 NOTE — ED Provider Notes (Signed)
 [facility]    CSN: [id number] Arrival date & time: 07/01/23  1126      History   Chief Complaint Chief Complaint  Patient presents with   Fever    Chest pain and sore throat, feels extremely weak - Entered by patient    HPI Sandra Walker is a 43 y.o. female.   Patient presents with cough, sore throat, fever, severe body aches, headache, and chills that began on 3/31. Denies vomiting, diarrhea, shortness of breath, and chest pain.  Patient reports she took Tylenol last night with minimal relief.  Patient states that her symptoms are keeping her up at night.  Patient is currently on antibiotics for a urinary tract infection.  Patient states that her urinary symptoms have resolved since starting the antibiotic.  Denies flank pain, abdominal pain, and hematuria.   Fever   History reviewed. No pertinent past medical history.  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      Home Medications    Prior to Admission medications   Medication Sig Start Date End Date Taking? Authorizing Provider  lidocaine (XYLOCAINE) 2 % solution Use as directed 15 mLs in the mouth or throat as needed for mouth pain. 07/01/23  Yes Susann Givens, [place] Deleo A, NP  nitrofurantoin, macrocrystal-monohydrate, (MACROBID) 100 MG capsule Take by mouth. 06/28/23 07/05/23 Yes [provider]  promethazine-dextromethorphan (PROMETHAZINE-DM) 6.25-15 MG/5ML syrup Take 5 mLs by mouth at bedtime as needed for cough. 07/01/23  Yes Wynonia Lawman A, NP  Polyethylene Glycol 4500 POWD 17 g by Does not apply route daily at 6 (six) AM. Dissolve 1 capful of powder in one 8 ounce glass of water. Repeat daily or as needed to soften stool 05/18/23   Rising, Lurena Joiner, PA-C    Family History Family History  Problem Relation Age of Onset   Healthy  Mother    Heart Problems Father    Diabetes Father    Breast cancer Maternal Grandmother 78    Social History Social History   Tobacco Use   Smoking status: Never   Smokeless tobacco: Never  Substance Use Topics   Alcohol use: Yes   Drug use: Not Currently     Allergies   Penicillin g, Latex, Penicillins, and Shellfish allergy   Review of Systems Review of Systems  Constitutional:  Positive for fever.   Per HPI  Physical Exam Triage Vital Signs ED Triage Vitals [07/01/23 1152]  Encounter Vitals Group     BP 115/78     Systolic BP Percentile      Diastolic BP Percentile      Pulse Rate (!) 126     Resp 18     Temp (!) 102.5 F (39.2 C)     Temp Source Oral     SpO2 97 %     Weight      Height      Head Circumference      Peak Flow      Pain Score 8     Pain Loc      Pain Education      Exclude from Growth Chart    No data found.  Updated Vital Signs BP 115/78 (BP Location: Left Arm)   Pulse (!) 122   Temp (!) 100.5 F (38.1 C) (Oral)  Resp 18   LMP 06/10/2023 (Approximate)   SpO2 97%   Visual Acuity Right Eye Distance:   Left Eye Distance:   Bilateral Distance:    Right Eye Near:   Left Eye Near:    Bilateral Near:     Physical Exam Vitals and nursing note reviewed.  Constitutional:      General: She is awake. She is not in acute distress.    Appearance: Normal appearance. She is well-developed and well-groomed. She is not ill-appearing.  HENT:     Right Ear: Tympanic membrane, ear canal and external ear normal.     Left Ear: Tympanic membrane, ear canal and external ear normal.     Nose: Congestion and rhinorrhea present.     Mouth/Throat:     Mouth: Mucous membranes are moist.     Pharynx: Posterior oropharyngeal erythema present. No oropharyngeal exudate.  Cardiovascular:     Rate and Rhythm: Regular rhythm. Tachycardia present.  Pulmonary:     Effort: Pulmonary effort is normal.     Breath sounds: Normal breath sounds.   Abdominal:     Tenderness: There is no right CVA tenderness or left CVA tenderness.  Skin:    General: Skin is warm and dry.  Neurological:     Mental Status: She is alert.  Psychiatric:        Behavior: Behavior is cooperative.      UC Treatments / Results  Labs (all labs ordered are listed, but only abnormal results are displayed) Labs Reviewed  POCT INFLUENZA A/B - Normal    EKG   Radiology No results found.  Procedures Procedures (including critical care time)  Medications Ordered in UC Medications  acetaminophen (TYLENOL) tablet 650 mg (650 mg Oral Given 07/01/23 1156)  ibuprofen (ADVIL) tablet 800 mg (800 mg Oral Given 07/01/23 1245)    Initial Impression / Assessment and Plan / UC Course  I have reviewed the triage vital signs and the nursing notes.  Pertinent labs & imaging results that were available during my care of the patient were reviewed by me and considered in my medical decision making (see chart for details).     Upon assessment congestion and rhinorrhea are present, mild erythema noted to pharynx.  Tachycardia is present likely related to presence of fever.  Given Tylenol and ibuprofen in clinic with relief of bodyaches as well as decrease in fever and heart rate.  Prescribed lidocaine as needed for sore throat.  Prescribed Promethazine DM as needed for cough at night.  Discussed alternating between Tylenol and ibuprofen for body aches and fever.  Discussed importance of hydration.  Discussed return precautions. Final Clinical Impressions(s) / UC Diagnoses   Final diagnoses:  Viral respiratory illness  Acute cough  Sore throat     Discharge Instructions      You tested negative for flu today as discussed I believe your symptoms are likely related to a viral respiratory illness.   I have prescribed lidocaine that you can gargle and spit as needed for sore throat. Otherwise you can alternate between 650 of Tylenol (do not exceed 4000 mg in a  day) and 400 to 600 mg of ibuprofen (do not exceed 2400 mg in a day) every 4-6 hours as needed for body aches, sore throat, headache, and fever.  Have also prescribed Promethazine DM cough syrup that you can take at night as needed for cough.  This can make you drowsy so do not drive, work, or drink alcohol while taking  this.  Be sure you are staying hydrated and getting plenty of rest.  Return here for symptoms persist or worsen.   ED Prescriptions     Medication Sig Dispense Auth. Provider   promethazine-dextromethorphan (PROMETHAZINE-DM) 6.25-15 MG/5ML syrup Take 5 mLs by mouth at bedtime as needed for cough. 118 mL Susann Givens, Abbegayle Denault A, NP   lidocaine (XYLOCAINE) 2 % solution Use as directed 15 mLs in the mouth or throat as needed for mouth pain. 100 mL Wynonia Lawman A, NP      PDMP not reviewed this encounter.   Wynonia Lawman A, NP 07/01/23 1328

## 2023-07-01 NOTE — Discharge Instructions (Addendum)
 You tested negative for flu today as discussed I believe your symptoms are likely related to a viral respiratory illness.   I have prescribed lidocaine that you can gargle and spit as needed for sore throat. Otherwise you can alternate between 650 of Tylenol (do not exceed 4000 mg in a day) and 400 to 600 mg of ibuprofen (do not exceed 2400 mg in a day) every 4-6 hours as needed for body aches, sore throat, headache, and fever.  Have also prescribed Promethazine DM cough syrup that you can take at night as needed for cough.  This can make you drowsy so do not drive, work, or drink alcohol while taking this.  Be sure you are staying hydrated and getting plenty of rest.  Return here for symptoms persist or worsen.

## 2023-07-06 ENCOUNTER — Other Ambulatory Visit (HOSPITAL_BASED_OUTPATIENT_CLINIC_OR_DEPARTMENT_OTHER): Payer: Self-pay

## 2023-07-06 ENCOUNTER — Other Ambulatory Visit: Payer: Self-pay

## 2023-07-06 ENCOUNTER — Emergency Department (HOSPITAL_BASED_OUTPATIENT_CLINIC_OR_DEPARTMENT_OTHER)
Admission: EM | Admit: 2023-07-06 | Discharge: 2023-07-06 | Disposition: A | Discharge status: Home / Self Care | Payer: Self-pay | Attending: Emergency Medicine | Admitting: Emergency Medicine

## 2023-07-06 ENCOUNTER — Encounter (HOSPITAL_BASED_OUTPATIENT_CLINIC_OR_DEPARTMENT_OTHER): Payer: Self-pay | Admitting: Emergency Medicine

## 2023-07-06 DIAGNOSIS — R112 Nausea with vomiting, unspecified: Secondary | ICD-10-CM | POA: Insufficient documentation

## 2023-07-06 DIAGNOSIS — Z91040 Latex allergy status: Secondary | ICD-10-CM | POA: Insufficient documentation

## 2023-07-06 DIAGNOSIS — R1013 Epigastric pain: Secondary | ICD-10-CM | POA: Insufficient documentation

## 2023-07-06 LAB — COMPREHENSIVE METABOLIC PANEL WITH GFR
ALT: 73 U/L — ABNORMAL HIGH (ref 0–44)
AST: 60 U/L — ABNORMAL HIGH (ref 15–41)
Albumin: 4.5 g/dL (ref 3.5–5.0)
Alkaline Phosphatase: 51 U/L (ref 38–126)
Anion gap: 9 (ref 5–15)
BUN: 7 mg/dL (ref 6–20)
CO2: 26 mmol/L (ref 22–32)
Calcium: 9.1 mg/dL (ref 8.9–10.3)
Chloride: 102 mmol/L (ref 98–111)
Creatinine, Ser: 0.8 mg/dL (ref 0.44–1.00)
GFR, Estimated: >60 mL/min (ref >60)
Glucose, Bld: 88 mg/dL (ref 70–99)
Potassium: 4.5 mmol/L (ref 3.5–5.1)
Sodium: 137 mmol/L (ref 135–145)
Total Bilirubin: 1 mg/dL (ref 0.0–1.2)
Total Protein: 8.1 g/dL (ref 6.5–8.1)

## 2023-07-06 LAB — CBC WITH DIFFERENTIAL/PLATELET
Abs Immature Granulocytes: 0.01 10*3/uL (ref 0.00–0.07)
Basophils Absolute: 0 10*3/uL (ref 0.0–0.1)
Basophils Relative: 1 %
Eosinophils Absolute: 0.1 10*3/uL (ref 0.0–0.5)
Eosinophils Relative: 3 %
HCT: 41.1 % (ref 36.0–46.0)
Hemoglobin: 13.8 g/dL (ref 12.0–15.0)
Immature Granulocytes: 0 %
Lymphocytes Relative: 48 %
Lymphs Abs: 2 10*3/uL (ref 0.7–4.0)
MCH: 31.8 pg (ref 26.0–34.0)
MCHC: 33.6 g/dL (ref 30.0–36.0)
MCV: 94.7 fL (ref 80.0–100.0)
Monocytes Absolute: 0.5 10*3/uL (ref 0.1–1.0)
Monocytes Relative: 11 %
Neutro Abs: 1.5 10*3/uL — ABNORMAL LOW (ref 1.7–7.7)
Neutrophils Relative %: 37 %
Platelets: 147 10*3/uL — ABNORMAL LOW (ref 150–400)
RBC: 4.34 MIL/uL (ref 3.87–5.11)
RDW: 13 % (ref 11.5–15.5)
Smear Review: ADEQUATE
WBC: 4.1 10*3/uL (ref 4.0–10.5)
nRBC: 0 % (ref 0.0–0.2)

## 2023-07-06 LAB — URINALYSIS, W/ REFLEX TO CULTURE (INFECTION SUSPECTED)
Bacteria, UA: NONE SEEN
Bilirubin Urine: NEGATIVE
Glucose, UA: NEGATIVE mg/dL
Ketones, ur: NEGATIVE mg/dL
Leukocytes,Ua: NEGATIVE
Nitrite: NEGATIVE
Protein, ur: 30 mg/dL — AB
Specific Gravity, Urine: 1.027 (ref 1.005–1.030)
Squamous Epithelial / HPF: >50 /HPF (ref 0–5)
pH: 7.5 (ref 5.0–8.0)

## 2023-07-06 LAB — PREGNANCY, URINE: Preg Test, Ur: NEGATIVE

## 2023-07-06 LAB — LIPASE, BLOOD: Lipase: 27 U/L (ref 11–51)

## 2023-07-06 MED ORDER — ONDANSETRON HCL 4 MG PO TABS
4.0000 mg | ORAL_TABLET | Freq: Three times a day (TID) | ORAL | 0 refills | Status: AC | PRN
  Filled 2023-07-06: qty 12, 4d supply, fill #0

## 2023-07-06 MED ORDER — ONDANSETRON HCL 4 MG/2ML IJ SOLN
4.0000 mg | Freq: Once | INTRAMUSCULAR | Status: AC
  Administered 2023-07-06: 4 mg via INTRAVENOUS
  Filled 2023-07-06: qty 2

## 2023-07-06 MED ORDER — PANTOPRAZOLE SODIUM 40 MG PO TBEC
40.0000 mg | DELAYED_RELEASE_TABLET | Freq: Every day | ORAL | 0 refills | Status: AC
  Filled 2023-07-06: qty 30, 30d supply, fill #0

## 2023-07-06 MED ORDER — ALUM & MAG HYDROXIDE-SIMETH 200-200-20 MG/5ML PO SUSP
30.0000 mL | Freq: Once | ORAL | Status: AC
  Administered 2023-07-06: 30 mL via ORAL
  Filled 2023-07-06: qty 30

## 2023-07-06 MED ORDER — SODIUM CHLORIDE 0.9 % IV BOLUS
1000.0000 mL | Freq: Once | INTRAVENOUS | Status: AC
  Administered 2023-07-06: 1000 mL via INTRAVENOUS

## 2023-07-06 NOTE — ED Notes (Signed)
 ED Provider at bedside.

## 2023-07-06 NOTE — Discharge Instructions (Addendum)
 We evaluated you for your abdominal pain.  We believe your symptoms are most likely due to stomach inflammation.  We have started you on an antiacid medication and prescribed you nausea medicine to take as needed.  You can also buy over-the-counter Mylanta and try this for your symptoms.  We noticed that your liver tests were very slightly abnormal.  We do not think this is related to your abdominal pain.  Please have this rechecked with your primary doctor in 1 week.  Please return to the emergency department if you develop any new or worsening symptoms such as severe or uncontrolled pain, uncontrolled vomiting, fevers, bloody or black stools, or any other new symptoms.

## 2023-07-06 NOTE — ED Triage Notes (Signed)
 Pt c/o upper abd pain x 5 days, taking abx for UTI x 6 days. Endorses nausea

## 2023-07-06 NOTE — ED Notes (Signed)
 Discharge paperwork given and verbally understood.

## 2023-07-06 NOTE — ED Provider Notes (Signed)
 [place] EMERGENCY DEPARTMENT AT [facility] Provider Note  CSN: [id number] Arrival date & time: 07/06/23 4098  Chief Complaint(s) Abdominal Pain  HPI Sandra Walker is a 43 y.o. female without significant past medical history presenting to the emergency department with epigastric pain.  Patient reports pain for the past 5 days.  Reports associated nausea, no vomiting.  No melena or hematochezia.  Reports some chills vomiting.  Did recently have a UTI, started on Macrobid, symptoms are improving.  No back pain.  Denies similar episode previously.  Has not tried anything.    Past Medical History History reviewed. No pertinent past medical history. 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 Authorizing Provider  ondansetron (ZOFRAN) 4 MG tablet Take 1 tablet (4 mg total) by mouth every 8 (eight) hours as needed for nausea or vomiting. 07/06/23  Yes Lonell Grandchild, MD  pantoprazole (PROTONIX) 40 MG tablet Take 1 tablet (40 mg total) by mouth daily. 07/06/23  Yes Lonell Grandchild, MD  lidocaine (XYLOCAINE) 2 % solution Use as directed 15 mLs in the mouth or throat as needed for mouth pain. 07/01/23   Wynonia Lawman A, NP  Polyethylene Glycol 4500 POWD 17 g by Does not apply route daily at 6 (six) AM. Dissolve 1 capful of powder in one 8 ounce glass of water. Repeat daily or as needed to soften stool 05/18/23   Rising, Lurena Joiner, PA-C  promethazine-dextromethorphan (PROMETHAZINE-DM) 6.25-15 MG/5ML syrup Take 5 mLs by mouth at bedtime as needed for cough. 07/01/23   Letta Kocher, NP                                                                                                                                    Past Surgical History Past Surgical History:  Procedure Laterality Date   CHOLECYSTECTOMY     Family History Family History   Problem Relation Age of Onset   Healthy Mother    Heart Problems Father    Diabetes Father    Breast cancer Maternal Grandmother 33    Social History Social History   Tobacco Use   Smoking status: Never   Smokeless tobacco: Never  Substance Use Topics   Alcohol use: Yes   Drug use: Not Currently   Allergies Penicillin g, Latex, Penicillins, and Shellfish allergy  Review of Systems Review of Systems  All other systems reviewed and are negative.   Physical Exam Vital Signs  I have reviewed the triage vital signs BP 113/77 (BP Location: Right Arm)   Pulse 68   Temp 98.1 F (36.7 C) (Oral)   Resp 16   Wt 81.6 kg   LMP 06/10/2023 (Approximate)   SpO2 99%   BMI 27.37 kg/m  Physical Exam Vitals and nursing note reviewed.  Constitutional:  General: She is not in acute distress.    Appearance: She is well-developed.  HENT:     Head: Normocephalic and atraumatic.     Mouth/Throat:     Mouth: Mucous membranes are moist.  Eyes:     Pupils: Pupils are equal, round, and reactive to light.  Cardiovascular:     Rate and Rhythm: Normal rate and regular rhythm.     Heart sounds: No murmur heard. Pulmonary:     Effort: Pulmonary effort is normal. No respiratory distress.     Breath sounds: Normal breath sounds.  Abdominal:     General: Abdomen is flat.     Palpations: Abdomen is soft.     Tenderness: There is no abdominal tenderness. There is no right CVA tenderness or left CVA tenderness.  Musculoskeletal:        General: No tenderness.     Right lower leg: No edema.     Left lower leg: No edema.  Skin:    General: Skin is warm and dry.  Neurological:     General: No focal deficit present.     Mental Status: She is alert. Mental status is at baseline.  Psychiatric:        Mood and Affect: Mood normal.        Behavior: Behavior normal.     ED Results and Treatments Labs (all labs ordered are listed, but only abnormal results are displayed) Labs Reviewed   COMPREHENSIVE METABOLIC PANEL WITH GFR - Abnormal; Notable for the following components:      Result Value   AST 60 (*)    ALT 73 (*)    All other components within normal limits  CBC WITH DIFFERENTIAL/PLATELET - Abnormal; Notable for the following components:   Platelets 147 (*)    Neutro Abs 1.5 (*)    All other components within normal limits  URINALYSIS, W/ REFLEX TO CULTURE (INFECTION SUSPECTED) - Abnormal; Notable for the following components:   APPearance HAZY (*)    Hgb urine dipstick SMALL (*)    Protein, ur 30 (*)    All other components within normal limits  LIPASE, BLOOD  PREGNANCY, URINE                                                                                                                          Radiology No results found.  Pertinent labs & imaging results that were available during my care of the patient were reviewed by me and considered in my medical decision making (see MDM for details).  Medications Ordered in ED Medications  sodium chloride 0.9 % bolus 1,000 mL (1,000 mLs Intravenous New Bag/Given 07/06/23 1016)  ondansetron (ZOFRAN) injection 4 mg (4 mg Intravenous Given 07/06/23 1017)  alum & mag hydroxide-simeth (MAALOX/MYLANTA) 200-200-20 MG/5ML suspension 30 mL (30 mLs Oral Given 07/06/23 1018)  Procedures Procedures  (including critical care time)  Medical Decision Making / ED Course   MDM:  43 year old presenting to the emergency department with abdominal pain.  Patient overall well-appearing, physical examination without focal tenderness.  Suspect possible gastritis or GERD, will give Maalox, nausea medication.  Lower concern for other dangerous process such as pancreatitis, obstruction, perforation, volvulus, cholecystitis, biliary colic, appendicitis, but will check labs and reassess after treatment.  If  symptoms improve with Maalox and nausea medication, laboratory testing otherwise reassuring, likely discharge.  Could be triggered by recent starting Macrobid given pain began today after starting Macrobid.  Less likely pyelonephritis or worsening UTI with no CVA tenderness.  Clinical Course as of 07/06/23 1240  Mon Jul 06, 2023  1231 Patient reports feeling better after receiving medication.  Workup overall reassuring.  Suspect possible gastritis.  LFT testing is very mildly abnormal but she has no right upper quadrant tenderness to suggest gallbladder problem.  Will prescribe antiacid medication. Will discharge patient to home. All questions answered. Patient comfortable with plan of discharge. Return precautions discussed with patient and specified on the after visit summary.  [WS]    Clinical Course User Index [WS] Lonell Grandchild, MD     Additional history obtained:  -External records from outside source obtained and reviewed including: Chart review including previous notes, labs, imaging, consultation notes including prior er notes    Lab Tests: -I ordered, reviewed, and interpreted labs.   The pertinent results include:   Labs Reviewed  COMPREHENSIVE METABOLIC PANEL WITH GFR - Abnormal; Notable for the following components:      Result Value   AST 60 (*)    ALT 73 (*)    All other components within normal limits  CBC WITH DIFFERENTIAL/PLATELET - Abnormal; Notable for the following components:   Platelets 147 (*)    Neutro Abs 1.5 (*)    All other components within normal limits  URINALYSIS, W/ REFLEX TO CULTURE (INFECTION SUSPECTED) - Abnormal; Notable for the following components:   APPearance HAZY (*)    Hgb urine dipstick SMALL (*)    Protein, ur 30 (*)    All other components within normal limits  LIPASE, BLOOD  PREGNANCY, URINE    Notable for nonspecific mild LFT abnormalities and mild low platelets.    Medicines ordered and prescription drug management: Meds  ordered this encounter  Medications   sodium chloride 0.9 % bolus 1,000 mL   ondansetron (ZOFRAN) injection 4 mg   alum & mag hydroxide-simeth (MAALOX/MYLANTA) 200-200-20 MG/5ML suspension 30 mL   ondansetron (ZOFRAN) 4 MG tablet    Sig: Take 1 tablet (4 mg total) by mouth every 8 (eight) hours as needed for nausea or vomiting.    Dispense:  12 tablet    Refill:  0   pantoprazole (PROTONIX) 40 MG tablet    Sig: Take 1 tablet (40 mg total) by mouth daily.    Dispense:  30 tablet    Refill:  0    -I have reviewed the patients home medicines and have made adjustments as needed    Reevaluation: After the interventions noted above, I reevaluated the patient and found that their symptoms have improved  Co morbidities that complicate the patient evaluation History reviewed. No pertinent past medical history.    Dispostion: Disposition decision including need for hospitalization was considered, and patient discharged from emergency department.    Final Clinical Impression(s) / ED Diagnoses Final diagnoses:  Epigastric pain     This  chart was dictated using voice recognition software.  Despite best efforts to proofread,  errors can occur which can change the documentation meaning.    Lonell Grandchild, MD 07/06/23 1240

## 2023-10-06 ENCOUNTER — Institutional Professional Consult (permissible substitution): Payer: Self-pay | Admitting: Surgical

## 2023-10-12 ENCOUNTER — Ambulatory Visit (INDEPENDENT_AMBULATORY_CARE_PROVIDER_SITE_OTHER): Payer: Self-pay | Admitting: Surgical

## 2023-10-12 DIAGNOSIS — D1800 Hemangioma unspecified site: Secondary | ICD-10-CM

## 2023-10-12 NOTE — Progress Notes (Signed)
   Referring Provider Pa, [facility] [address] [place],  [place] [address]   CC:  Chief Complaint  Patient presents with   Consult      Sandra Walker is an 43 y.o. female.  HPI: Patient is a 43 year old female here for evaluation of her left lower lip.  She previously had a venous lake lasered in this area, she reports it significantly improved after her laser in 2022.  She reports it returned almost exactly 1 year ago.  She is interested in options for removal whether that is via laser or surgical excision.  Reviewed EMR, previously seen in our office April 2022, subsequently had ND Yag laser and reports improvement.  Physical Exam    07/06/2023   12:30 PM 07/06/2023   11:30 AM 07/06/2023   10:00 AM  Vitals with BMI  Systolic 117 113 881  Diastolic 76 77 82  Pulse 67 68 79    General:  No acute distress,  Alert and oriented, Non-Toxic, Normal speech and affect Lip: Left lower lip venous lake is noted, smaller than previous venous lake  Assessment/Plan Discussed with patient that Nd YAG laser or another laser in the 1064 nm range is recommended for lasering of venous lakes.  We discussed that the BBL would likely not penetrate enough to provide her any improvement.  She is interested in possible surgical excision, I discussed with the patient that I would notify Dr. Lowery of her question about surgical excision and we will plan a telephone visit tomorrow to further discuss.    Sandra Walker 10/12/2023, 4:10 PM

## 2023-10-13 ENCOUNTER — Ambulatory Visit (INDEPENDENT_AMBULATORY_CARE_PROVIDER_SITE_OTHER): Payer: Self-pay | Admitting: Surgical

## 2023-10-13 DIAGNOSIS — D1800 Hemangioma unspecified site: Secondary | ICD-10-CM

## 2023-10-13 NOTE — Progress Notes (Signed)
 43 year old female with a venous lake of her left lip.  She has previously had it lasered with antibiotics and it improved, but has returned.  Discussed patient's case with Dr. Lowery, she recommends trying BBL laser under vascular settings to treat.  Discussed plan with patient, discussed that the BBL may provide some improvement, but it is possible that the venous lake is too large to be treated with BBL and that we could try a few treatments.  We did discuss that with the BBL laser there is a risk of burning, particularly in patients with Fitzpatrick levels 4 and higher.  We discussed that she is likely a Fitzpatrick 5, we discussed the planned treatment protocol and all of her questions were answered to her content.  Will plan to see her next week.  The patient gave consent to have this visit done by telemedicine / virtual visit, two identifiers were used to identify patient. This is also consent for access the chart and treat the patient via this visit. The patient is located in [place] .  I, the provider, am at the office.  We spent 5 minutes together for the visit.  Joined by telephone.

## 2023-10-21 ENCOUNTER — Ambulatory Visit (INDEPENDENT_AMBULATORY_CARE_PROVIDER_SITE_OTHER): Payer: Self-pay | Admitting: Surgical

## 2023-10-21 DIAGNOSIS — D1800 Hemangioma unspecified site: Secondary | ICD-10-CM

## 2023-10-21 NOTE — Progress Notes (Signed)
 Preoperative Dx: venous lake  Postoperative Dx:  same  Procedure: laser to lip   Anesthesia: none  Description of Procedure:  Risks and complications were explained to the patient. Consent was confirmed and signed. Time out was called and all information was confirmed to be correct. The area  area was prepped with alcohol and wiped dry. The 590 nm laser was set at 10 J/cm2. The lip was lasered.   It was then set at 560 nm, 11 J, 30 ms and lasered twice.  The patient tolerated the procedure well and there were no complications. The patient is to follow up in 4 weeks.

## 2023-10-22 ENCOUNTER — Other Ambulatory Visit: Payer: Self-pay

## 2023-10-22 ENCOUNTER — Emergency Department (HOSPITAL_BASED_OUTPATIENT_CLINIC_OR_DEPARTMENT_OTHER)
Admission: EM | Admit: 2023-10-22 | Discharge: 2023-10-22 | Disposition: A | Discharge status: Home / Self Care | Payer: Self-pay | Source: Ambulatory Visit

## 2023-10-22 DIAGNOSIS — N939 Abnormal uterine and vaginal bleeding, unspecified: Secondary | ICD-10-CM | POA: Insufficient documentation

## 2023-10-22 DIAGNOSIS — Z91040 Latex allergy status: Secondary | ICD-10-CM | POA: Insufficient documentation

## 2023-10-22 LAB — CBC
HCT: 39.7 % (ref 36.0–46.0)
Hemoglobin: 13.4 g/dL (ref 12.0–15.0)
MCH: 31.9 pg (ref 26.0–34.0)
MCHC: 33.8 g/dL (ref 30.0–36.0)
MCV: 94.5 fL (ref 80.0–100.0)
Platelets: 194 K/uL (ref 150–400)
RBC: 4.2 MIL/uL (ref 3.87–5.11)
RDW: 12.9 % (ref 11.5–15.5)
WBC: 4.8 K/uL (ref 4.0–10.5)
nRBC: 0 % (ref 0.0–0.2)

## 2023-10-22 LAB — PREGNANCY, URINE: Preg Test, Ur: NEGATIVE

## 2023-10-22 MED ORDER — NATAZIA 3/2-2/2-3/1 MG PO TABS: 1.0000 | ORAL_TABLET | Freq: Every day | ORAL | 0 refills | Status: AC

## 2023-10-22 NOTE — ED Triage Notes (Signed)
 Pt caox4, ambulatory c/o vaginal bleeding x12 days, cramping x2 days, passed a large clot today. States she took plan B 6/22 but had menstrual cycle 6/26.

## 2023-10-22 NOTE — Discharge Instructions (Addendum)
 Follow up with GYN as scheduled. Take medication as prescribed. Return to the ER for worsening or concerning symptoms.

## 2023-10-22 NOTE — ED Provider Notes (Signed)
 [place] EMERGENCY DEPARTMENT AT [facility] Provider Note   CSN: [id number] Arrival date & time: 10/22/23  1512     Patient presents with: Vaginal Bleeding   Sandra Walker is a 43 y.o. female.   43 yo female presents with vaginal bleeding. Patient states had her IUD removed in 01/2023. Had menstrual cycle on 6/26-7/1 which was longer than her usual. Then had return of bleeding and has had bleeding x 12 days with clots and cramping at times. Reports taking plan B twice after intercourse. Has scheduled GYN but can't be seen until September. No other symptoms or concerns.        Prior to Admission medications   Medication Sig Start Date End Date Taking? Authorizing Provider  Estradiol Valerate-Dienogest (NATAZIA ) 3/2-2/2-3/1 MG tablet Take 1 tablet by mouth daily. 10/22/23  Yes Beverley Leita LABOR, PA-C  lidocaine  (XYLOCAINE ) 2 % solution Use as directed 15 mLs in the mouth or throat as needed for mouth pain. 07/01/23   Johnie Flaming A, NP  ondansetron  (ZOFRAN ) 4 MG tablet Take 1 tablet (4 mg total) by mouth every 8 (eight) hours as needed for nausea or vomiting. 07/06/23   Francesca Elsie CROME, MD  pantoprazole  (PROTONIX ) 40 MG tablet Take 1 tablet (40 mg total) by mouth daily. 07/06/23   Francesca Elsie CROME, MD  Polyethylene Glycol 4500 POWD 17 g by Does not apply route daily at 6 (six) AM. Dissolve 1 capful of powder in one 8 ounce glass of water. Repeat daily or as needed to soften stool 05/18/23   Rising, [place], PA-C  promethazine -dextromethorphan (PROMETHAZINE -DM) 6.25-15 MG/5ML syrup Take 5 mLs by mouth at bedtime as needed for cough. 07/01/23   Johnie Flaming A, NP    Allergies: Penicillin g, Latex, Penicillins, and Shellfish allergy    Review of Systems Negative except as per HPI Updated Vital Signs BP (!) 134/91   Pulse 91   Temp 97.7 F (36.5 C)   Resp 15   LMP 09/24/2023 (Exact Date)   SpO2 100%   Physical Exam Vitals and nursing note reviewed.   Constitutional:      General: She is not in acute distress.    Appearance: She is well-developed. She is not diaphoretic.  HENT:     Head: Normocephalic and atraumatic.  Pulmonary:     Effort: Pulmonary effort is normal.  Abdominal:     Palpations: Abdomen is soft.     Tenderness: There is no abdominal tenderness.  Skin:    General: Skin is warm and dry.     Findings: No erythema or rash.  Neurological:     Mental Status: She is alert and oriented to person, place, and time.  Psychiatric:        Behavior: Behavior normal.     (all labs ordered are listed, but only abnormal results are displayed) Labs Reviewed  PREGNANCY, URINE  CBC    EKG: None  Radiology: No results found.   Procedures   Medications Ordered in the ED - No data to display                                  Medical Decision Making Amount and/or Complexity of Data Reviewed Labs: ordered.   This patient presents to the ED for concern of vaginal bleeding, this involves an extensive number of treatment options, and is a complaint that carries with it a high risk of complications  and morbidity.  The differential diagnosis includes pregnancy, hemorrhagic shock, AUB   Co morbidities / Chronic conditions that complicate the patient evaluation  Otherwise healthy   Additional history obtained:  Additional history obtained from EMR External records from outside source obtained and reviewed including recent records on file   Lab Tests:  I Ordered, and personally interpreted labs.  The pertinent results include:  hcg negative. CBC WNL with hemaglobin 13.4 and HCT 39.7.    Problem List / ED Course / Critical interventions / Medication management  43 year old female presents with concern for heavy vaginal bleeding as above.  She is hemodynamically stable, hCG negative.  Suspect her heavy bleeding is due to taking Plan B twice.  She does not have history of PE or DVT, is a non-smoker.  Would like to  trial medication to stop her bleeding, will prescribe birth control pill.  Recommend follow-up with gynecology as scheduled, return to ER for worsening or concerning symptoms. I have reviewed the patients home medicines and have made adjustments as needed    Social Determinants of Health:  Has PCP, scheduled for follow-up with gynecology   Test / Admission - Considered:  Stable for discharge Consider US , given patient is hemodynamically stable, will defer to GYN      Final diagnoses:  Vaginal bleeding    ED Discharge Orders          Ordered    Estradiol Valerate-Dienogest (NATAZIA ) 3/2-2/2-3/1 MG tablet  Daily        10/22/23 1601               Beverley Leita LABOR, PA-C 10/22/23 1606    Kammerer, Megan L, DO 10/24/23 1823

## 2023-11-04 ENCOUNTER — Other Ambulatory Visit: Payer: Self-pay | Admitting: Surgical

## 2023-12-08 ENCOUNTER — Encounter: Payer: Self-pay | Admitting: Obstetrics and Gynecology
